# Patient Record
Sex: Male | Born: 1940 | Race: White | Hispanic: No | Marital: Married | Smoking: Never smoker
Health system: Southern US, Community
[De-identification: ages and names within clinical notes are randomized; demographics above are authoritative.]

## PROBLEM LIST (undated history)

## (undated) DIAGNOSIS — I351 Nonrheumatic aortic (valve) insufficiency: Secondary | ICD-10-CM

## (undated) DIAGNOSIS — E785 Hyperlipidemia, unspecified: Secondary | ICD-10-CM

## (undated) DIAGNOSIS — A491 Streptococcal infection, unspecified site: Secondary | ICD-10-CM

## (undated) DIAGNOSIS — E119 Type 2 diabetes mellitus without complications: Secondary | ICD-10-CM

## (undated) DIAGNOSIS — I350 Nonrheumatic aortic (valve) stenosis: Secondary | ICD-10-CM

## (undated) DIAGNOSIS — I10 Essential (primary) hypertension: Secondary | ICD-10-CM

---

## 2011-09-23 DIAGNOSIS — J209 Acute bronchitis, unspecified: Secondary | ICD-10-CM | POA: Diagnosis not present

## 2011-11-18 DIAGNOSIS — J069 Acute upper respiratory infection, unspecified: Secondary | ICD-10-CM | POA: Diagnosis not present

## 2012-07-15 DIAGNOSIS — Z23 Encounter for immunization: Secondary | ICD-10-CM | POA: Diagnosis not present

## 2012-09-05 DIAGNOSIS — J069 Acute upper respiratory infection, unspecified: Secondary | ICD-10-CM | POA: Diagnosis not present

## 2012-09-05 DIAGNOSIS — R059 Cough, unspecified: Secondary | ICD-10-CM | POA: Diagnosis not present

## 2012-09-05 DIAGNOSIS — R05 Cough: Secondary | ICD-10-CM | POA: Diagnosis not present

## 2012-10-06 DIAGNOSIS — E876 Hypokalemia: Secondary | ICD-10-CM | POA: Diagnosis not present

## 2012-10-06 DIAGNOSIS — E785 Hyperlipidemia, unspecified: Secondary | ICD-10-CM | POA: Diagnosis not present

## 2012-10-06 DIAGNOSIS — I359 Nonrheumatic aortic valve disorder, unspecified: Secondary | ICD-10-CM | POA: Diagnosis not present

## 2012-10-06 DIAGNOSIS — I10 Essential (primary) hypertension: Secondary | ICD-10-CM | POA: Diagnosis not present

## 2013-04-14 DIAGNOSIS — L570 Actinic keratosis: Secondary | ICD-10-CM | POA: Diagnosis not present

## 2013-07-16 DIAGNOSIS — Z23 Encounter for immunization: Secondary | ICD-10-CM | POA: Diagnosis not present

## 2013-09-06 DIAGNOSIS — M109 Gout, unspecified: Secondary | ICD-10-CM | POA: Diagnosis not present

## 2013-09-06 DIAGNOSIS — M7730 Calcaneal spur, unspecified foot: Secondary | ICD-10-CM | POA: Diagnosis not present

## 2013-09-06 DIAGNOSIS — M19079 Primary osteoarthritis, unspecified ankle and foot: Secondary | ICD-10-CM | POA: Diagnosis not present

## 2013-11-01 DIAGNOSIS — I10 Essential (primary) hypertension: Secondary | ICD-10-CM | POA: Diagnosis not present

## 2013-11-01 DIAGNOSIS — I359 Nonrheumatic aortic valve disorder, unspecified: Secondary | ICD-10-CM | POA: Diagnosis not present

## 2013-11-01 DIAGNOSIS — E785 Hyperlipidemia, unspecified: Secondary | ICD-10-CM | POA: Diagnosis not present

## 2013-11-09 DIAGNOSIS — I359 Nonrheumatic aortic valve disorder, unspecified: Secondary | ICD-10-CM | POA: Diagnosis not present

## 2013-12-14 DIAGNOSIS — H52229 Regular astigmatism, unspecified eye: Secondary | ICD-10-CM | POA: Diagnosis not present

## 2013-12-14 DIAGNOSIS — H2589 Other age-related cataract: Secondary | ICD-10-CM | POA: Diagnosis not present

## 2013-12-14 DIAGNOSIS — I10 Essential (primary) hypertension: Secondary | ICD-10-CM | POA: Diagnosis not present

## 2013-12-14 DIAGNOSIS — H5210 Myopia, unspecified eye: Secondary | ICD-10-CM | POA: Diagnosis not present

## 2013-12-31 DIAGNOSIS — M109 Gout, unspecified: Secondary | ICD-10-CM | POA: Diagnosis not present

## 2014-09-22 DIAGNOSIS — M109 Gout, unspecified: Secondary | ICD-10-CM | POA: Diagnosis not present

## 2014-09-22 DIAGNOSIS — J209 Acute bronchitis, unspecified: Secondary | ICD-10-CM | POA: Diagnosis not present

## 2015-01-16 DIAGNOSIS — D509 Iron deficiency anemia, unspecified: Secondary | ICD-10-CM | POA: Diagnosis not present

## 2015-01-16 DIAGNOSIS — Z23 Encounter for immunization: Secondary | ICD-10-CM | POA: Diagnosis not present

## 2015-01-16 DIAGNOSIS — Z9181 History of falling: Secondary | ICD-10-CM | POA: Diagnosis not present

## 2015-01-16 DIAGNOSIS — M109 Gout, unspecified: Secondary | ICD-10-CM | POA: Diagnosis not present

## 2015-01-16 DIAGNOSIS — Z0000 Encounter for general adult medical examination without abnormal findings: Secondary | ICD-10-CM | POA: Diagnosis not present

## 2015-01-16 DIAGNOSIS — Z1389 Encounter for screening for other disorder: Secondary | ICD-10-CM | POA: Diagnosis not present

## 2015-01-16 DIAGNOSIS — K219 Gastro-esophageal reflux disease without esophagitis: Secondary | ICD-10-CM | POA: Diagnosis not present

## 2015-01-16 DIAGNOSIS — R5383 Other fatigue: Secondary | ICD-10-CM | POA: Diagnosis not present

## 2015-01-20 DIAGNOSIS — L814 Other melanin hyperpigmentation: Secondary | ICD-10-CM | POA: Diagnosis not present

## 2015-01-20 DIAGNOSIS — L918 Other hypertrophic disorders of the skin: Secondary | ICD-10-CM | POA: Diagnosis not present

## 2015-01-20 DIAGNOSIS — L820 Inflamed seborrheic keratosis: Secondary | ICD-10-CM | POA: Diagnosis not present

## 2015-03-08 DIAGNOSIS — Z79899 Other long term (current) drug therapy: Secondary | ICD-10-CM | POA: Diagnosis not present

## 2015-05-30 DIAGNOSIS — I351 Nonrheumatic aortic (valve) insufficiency: Secondary | ICD-10-CM | POA: Diagnosis not present

## 2015-05-30 DIAGNOSIS — E785 Hyperlipidemia, unspecified: Secondary | ICD-10-CM | POA: Diagnosis not present

## 2015-05-30 DIAGNOSIS — I10 Essential (primary) hypertension: Secondary | ICD-10-CM | POA: Diagnosis not present

## 2015-05-30 DIAGNOSIS — I350 Nonrheumatic aortic (valve) stenosis: Secondary | ICD-10-CM | POA: Diagnosis not present

## 2015-09-01 DIAGNOSIS — Z23 Encounter for immunization: Secondary | ICD-10-CM | POA: Diagnosis not present

## 2016-02-05 DIAGNOSIS — E785 Hyperlipidemia, unspecified: Secondary | ICD-10-CM | POA: Diagnosis not present

## 2016-02-05 DIAGNOSIS — M109 Gout, unspecified: Secondary | ICD-10-CM | POA: Diagnosis not present

## 2016-02-05 DIAGNOSIS — I10 Essential (primary) hypertension: Secondary | ICD-10-CM | POA: Diagnosis not present

## 2016-02-05 DIAGNOSIS — D510 Vitamin B12 deficiency anemia due to intrinsic factor deficiency: Secondary | ICD-10-CM | POA: Diagnosis not present

## 2016-02-05 DIAGNOSIS — Z79899 Other long term (current) drug therapy: Secondary | ICD-10-CM | POA: Diagnosis not present

## 2016-02-05 DIAGNOSIS — R5383 Other fatigue: Secondary | ICD-10-CM | POA: Diagnosis not present

## 2016-02-05 DIAGNOSIS — Z0000 Encounter for general adult medical examination without abnormal findings: Secondary | ICD-10-CM | POA: Diagnosis not present

## 2016-05-21 DIAGNOSIS — I10 Essential (primary) hypertension: Secondary | ICD-10-CM | POA: Diagnosis not present

## 2016-05-21 DIAGNOSIS — M109 Gout, unspecified: Secondary | ICD-10-CM | POA: Diagnosis not present

## 2016-05-21 DIAGNOSIS — Z9181 History of falling: Secondary | ICD-10-CM | POA: Diagnosis not present

## 2016-05-21 DIAGNOSIS — Z1389 Encounter for screening for other disorder: Secondary | ICD-10-CM | POA: Diagnosis not present

## 2016-05-21 DIAGNOSIS — Z23 Encounter for immunization: Secondary | ICD-10-CM | POA: Diagnosis not present

## 2016-06-18 DIAGNOSIS — E785 Hyperlipidemia, unspecified: Secondary | ICD-10-CM | POA: Diagnosis not present

## 2016-06-18 DIAGNOSIS — I350 Nonrheumatic aortic (valve) stenosis: Secondary | ICD-10-CM | POA: Diagnosis not present

## 2016-06-18 DIAGNOSIS — I10 Essential (primary) hypertension: Secondary | ICD-10-CM | POA: Diagnosis not present

## 2017-02-26 DIAGNOSIS — H524 Presbyopia: Secondary | ICD-10-CM | POA: Diagnosis not present

## 2017-02-26 DIAGNOSIS — H52223 Regular astigmatism, bilateral: Secondary | ICD-10-CM | POA: Diagnosis not present

## 2017-02-26 DIAGNOSIS — H25813 Combined forms of age-related cataract, bilateral: Secondary | ICD-10-CM | POA: Diagnosis not present

## 2017-02-26 DIAGNOSIS — I10 Essential (primary) hypertension: Secondary | ICD-10-CM | POA: Diagnosis not present

## 2017-02-26 DIAGNOSIS — H35343 Macular cyst, hole, or pseudohole, bilateral: Secondary | ICD-10-CM | POA: Diagnosis not present

## 2017-04-01 DIAGNOSIS — C44229 Squamous cell carcinoma of skin of left ear and external auricular canal: Secondary | ICD-10-CM | POA: Diagnosis not present

## 2017-04-01 DIAGNOSIS — C44119 Basal cell carcinoma of skin of left eyelid, including canthus: Secondary | ICD-10-CM | POA: Diagnosis not present

## 2017-04-01 DIAGNOSIS — D485 Neoplasm of uncertain behavior of skin: Secondary | ICD-10-CM | POA: Diagnosis not present

## 2017-04-17 DIAGNOSIS — C44119 Basal cell carcinoma of skin of left eyelid, including canthus: Secondary | ICD-10-CM | POA: Diagnosis not present

## 2017-06-24 DIAGNOSIS — E7800 Pure hypercholesterolemia, unspecified: Secondary | ICD-10-CM | POA: Diagnosis not present

## 2017-06-24 DIAGNOSIS — Z1331 Encounter for screening for depression: Secondary | ICD-10-CM | POA: Diagnosis not present

## 2017-06-24 DIAGNOSIS — D519 Vitamin B12 deficiency anemia, unspecified: Secondary | ICD-10-CM | POA: Diagnosis not present

## 2017-06-24 DIAGNOSIS — Z0000 Encounter for general adult medical examination without abnormal findings: Secondary | ICD-10-CM | POA: Diagnosis not present

## 2017-06-24 DIAGNOSIS — Z9181 History of falling: Secondary | ICD-10-CM | POA: Diagnosis not present

## 2017-06-24 DIAGNOSIS — Z1339 Encounter for screening examination for other mental health and behavioral disorders: Secondary | ICD-10-CM | POA: Diagnosis not present

## 2017-06-24 DIAGNOSIS — Z79899 Other long term (current) drug therapy: Secondary | ICD-10-CM | POA: Diagnosis not present

## 2017-06-24 DIAGNOSIS — Z23 Encounter for immunization: Secondary | ICD-10-CM | POA: Diagnosis not present

## 2017-06-24 DIAGNOSIS — E789 Disorder of lipoprotein metabolism, unspecified: Secondary | ICD-10-CM | POA: Diagnosis not present

## 2017-07-07 DIAGNOSIS — Z87891 Personal history of nicotine dependence: Secondary | ICD-10-CM | POA: Diagnosis not present

## 2017-07-07 DIAGNOSIS — Z7982 Long term (current) use of aspirin: Secondary | ICD-10-CM | POA: Diagnosis not present

## 2017-07-07 DIAGNOSIS — I351 Nonrheumatic aortic (valve) insufficiency: Secondary | ICD-10-CM | POA: Diagnosis not present

## 2017-07-07 DIAGNOSIS — I350 Nonrheumatic aortic (valve) stenosis: Secondary | ICD-10-CM | POA: Diagnosis not present

## 2017-07-07 DIAGNOSIS — I10 Essential (primary) hypertension: Secondary | ICD-10-CM | POA: Diagnosis not present

## 2017-07-07 DIAGNOSIS — E785 Hyperlipidemia, unspecified: Secondary | ICD-10-CM | POA: Diagnosis not present

## 2017-07-21 DIAGNOSIS — Z6826 Body mass index (BMI) 26.0-26.9, adult: Secondary | ICD-10-CM | POA: Diagnosis not present

## 2017-07-21 DIAGNOSIS — J111 Influenza due to unidentified influenza virus with other respiratory manifestations: Secondary | ICD-10-CM | POA: Diagnosis not present

## 2017-11-24 DIAGNOSIS — R69 Illness, unspecified: Secondary | ICD-10-CM | POA: Diagnosis not present

## 2017-11-26 DIAGNOSIS — G3184 Mild cognitive impairment, so stated: Secondary | ICD-10-CM | POA: Diagnosis not present

## 2017-11-26 DIAGNOSIS — Z8249 Family history of ischemic heart disease and other diseases of the circulatory system: Secondary | ICD-10-CM | POA: Diagnosis not present

## 2017-11-26 DIAGNOSIS — M109 Gout, unspecified: Secondary | ICD-10-CM | POA: Diagnosis not present

## 2017-11-26 DIAGNOSIS — K219 Gastro-esophageal reflux disease without esophagitis: Secondary | ICD-10-CM | POA: Diagnosis not present

## 2017-11-26 DIAGNOSIS — E785 Hyperlipidemia, unspecified: Secondary | ICD-10-CM | POA: Diagnosis not present

## 2017-11-26 DIAGNOSIS — Z833 Family history of diabetes mellitus: Secondary | ICD-10-CM | POA: Diagnosis not present

## 2017-11-26 DIAGNOSIS — I10 Essential (primary) hypertension: Secondary | ICD-10-CM | POA: Diagnosis not present

## 2017-11-26 DIAGNOSIS — Z7982 Long term (current) use of aspirin: Secondary | ICD-10-CM | POA: Diagnosis not present

## 2017-11-26 DIAGNOSIS — Z809 Family history of malignant neoplasm, unspecified: Secondary | ICD-10-CM | POA: Diagnosis not present

## 2017-11-26 DIAGNOSIS — Z7722 Contact with and (suspected) exposure to environmental tobacco smoke (acute) (chronic): Secondary | ICD-10-CM | POA: Diagnosis not present

## 2018-02-24 DIAGNOSIS — E1165 Type 2 diabetes mellitus with hyperglycemia: Secondary | ICD-10-CM | POA: Diagnosis not present

## 2018-02-24 DIAGNOSIS — R5383 Other fatigue: Secondary | ICD-10-CM | POA: Diagnosis not present

## 2018-02-24 DIAGNOSIS — Z6826 Body mass index (BMI) 26.0-26.9, adult: Secondary | ICD-10-CM | POA: Diagnosis not present

## 2018-02-24 DIAGNOSIS — D519 Vitamin B12 deficiency anemia, unspecified: Secondary | ICD-10-CM | POA: Diagnosis not present

## 2018-02-24 DIAGNOSIS — R358 Other polyuria: Secondary | ICD-10-CM | POA: Diagnosis not present

## 2018-02-24 DIAGNOSIS — R634 Abnormal weight loss: Secondary | ICD-10-CM | POA: Diagnosis not present

## 2018-02-26 DIAGNOSIS — R748 Abnormal levels of other serum enzymes: Secondary | ICD-10-CM | POA: Diagnosis not present

## 2018-02-26 DIAGNOSIS — E1165 Type 2 diabetes mellitus with hyperglycemia: Secondary | ICD-10-CM | POA: Diagnosis not present

## 2018-02-26 DIAGNOSIS — Z6824 Body mass index (BMI) 24.0-24.9, adult: Secondary | ICD-10-CM | POA: Diagnosis not present

## 2018-02-26 DIAGNOSIS — R634 Abnormal weight loss: Secondary | ICD-10-CM | POA: Diagnosis not present

## 2018-02-26 DIAGNOSIS — R69 Illness, unspecified: Secondary | ICD-10-CM | POA: Diagnosis not present

## 2018-02-26 DIAGNOSIS — Z125 Encounter for screening for malignant neoplasm of prostate: Secondary | ICD-10-CM | POA: Diagnosis not present

## 2018-03-02 DIAGNOSIS — R69 Illness, unspecified: Secondary | ICD-10-CM | POA: Diagnosis not present

## 2018-03-12 DIAGNOSIS — Z1331 Encounter for screening for depression: Secondary | ICD-10-CM | POA: Diagnosis not present

## 2018-03-12 DIAGNOSIS — Z6825 Body mass index (BMI) 25.0-25.9, adult: Secondary | ICD-10-CM | POA: Diagnosis not present

## 2018-03-12 DIAGNOSIS — R634 Abnormal weight loss: Secondary | ICD-10-CM | POA: Diagnosis not present

## 2018-03-12 DIAGNOSIS — E1165 Type 2 diabetes mellitus with hyperglycemia: Secondary | ICD-10-CM | POA: Diagnosis not present

## 2018-03-13 DIAGNOSIS — R634 Abnormal weight loss: Secondary | ICD-10-CM | POA: Diagnosis not present

## 2018-03-13 DIAGNOSIS — K5730 Diverticulosis of large intestine without perforation or abscess without bleeding: Secondary | ICD-10-CM | POA: Diagnosis not present

## 2018-03-13 DIAGNOSIS — I700 Atherosclerosis of aorta: Secondary | ICD-10-CM | POA: Diagnosis not present

## 2018-03-24 DIAGNOSIS — R69 Illness, unspecified: Secondary | ICD-10-CM | POA: Diagnosis not present

## 2018-03-27 DIAGNOSIS — K5900 Constipation, unspecified: Secondary | ICD-10-CM | POA: Diagnosis not present

## 2018-03-27 DIAGNOSIS — R1030 Lower abdominal pain, unspecified: Secondary | ICD-10-CM | POA: Diagnosis not present

## 2018-03-28 DIAGNOSIS — R69 Illness, unspecified: Secondary | ICD-10-CM | POA: Diagnosis not present

## 2018-04-19 DIAGNOSIS — R69 Illness, unspecified: Secondary | ICD-10-CM | POA: Diagnosis not present

## 2018-04-23 DIAGNOSIS — R69 Illness, unspecified: Secondary | ICD-10-CM | POA: Diagnosis not present

## 2018-05-11 DIAGNOSIS — R69 Illness, unspecified: Secondary | ICD-10-CM | POA: Diagnosis not present

## 2018-05-25 DIAGNOSIS — R69 Illness, unspecified: Secondary | ICD-10-CM | POA: Diagnosis not present

## 2018-06-02 DIAGNOSIS — R69 Illness, unspecified: Secondary | ICD-10-CM | POA: Diagnosis not present

## 2018-07-06 DIAGNOSIS — R69 Illness, unspecified: Secondary | ICD-10-CM | POA: Diagnosis not present

## 2018-07-07 DIAGNOSIS — R69 Illness, unspecified: Secondary | ICD-10-CM | POA: Diagnosis not present

## 2018-07-17 DIAGNOSIS — E782 Mixed hyperlipidemia: Secondary | ICD-10-CM | POA: Diagnosis not present

## 2018-07-17 DIAGNOSIS — I10 Essential (primary) hypertension: Secondary | ICD-10-CM | POA: Diagnosis not present

## 2018-07-17 DIAGNOSIS — I350 Nonrheumatic aortic (valve) stenosis: Secondary | ICD-10-CM | POA: Diagnosis not present

## 2018-07-17 DIAGNOSIS — I351 Nonrheumatic aortic (valve) insufficiency: Secondary | ICD-10-CM | POA: Diagnosis not present

## 2018-07-22 DIAGNOSIS — I351 Nonrheumatic aortic (valve) insufficiency: Secondary | ICD-10-CM | POA: Diagnosis not present

## 2018-07-22 DIAGNOSIS — I350 Nonrheumatic aortic (valve) stenosis: Secondary | ICD-10-CM | POA: Diagnosis not present

## 2018-07-23 DIAGNOSIS — R69 Illness, unspecified: Secondary | ICD-10-CM | POA: Diagnosis not present

## 2018-08-21 DIAGNOSIS — R69 Illness, unspecified: Secondary | ICD-10-CM | POA: Diagnosis not present

## 2018-09-09 DIAGNOSIS — I350 Nonrheumatic aortic (valve) stenosis: Secondary | ICD-10-CM | POA: Diagnosis not present

## 2018-09-09 DIAGNOSIS — E782 Mixed hyperlipidemia: Secondary | ICD-10-CM | POA: Diagnosis not present

## 2018-09-09 DIAGNOSIS — I351 Nonrheumatic aortic (valve) insufficiency: Secondary | ICD-10-CM | POA: Diagnosis not present

## 2018-09-09 DIAGNOSIS — I10 Essential (primary) hypertension: Secondary | ICD-10-CM | POA: Diagnosis not present

## 2018-09-29 DIAGNOSIS — I10 Essential (primary) hypertension: Secondary | ICD-10-CM | POA: Diagnosis not present

## 2018-09-29 DIAGNOSIS — Z833 Family history of diabetes mellitus: Secondary | ICD-10-CM | POA: Diagnosis not present

## 2018-09-29 DIAGNOSIS — Z809 Family history of malignant neoplasm, unspecified: Secondary | ICD-10-CM | POA: Diagnosis not present

## 2018-09-29 DIAGNOSIS — Z7982 Long term (current) use of aspirin: Secondary | ICD-10-CM | POA: Diagnosis not present

## 2018-09-29 DIAGNOSIS — Z8249 Family history of ischemic heart disease and other diseases of the circulatory system: Secondary | ICD-10-CM | POA: Diagnosis not present

## 2018-09-29 DIAGNOSIS — E785 Hyperlipidemia, unspecified: Secondary | ICD-10-CM | POA: Diagnosis not present

## 2018-10-22 DIAGNOSIS — R69 Illness, unspecified: Secondary | ICD-10-CM | POA: Diagnosis not present

## 2018-11-16 DIAGNOSIS — R69 Illness, unspecified: Secondary | ICD-10-CM | POA: Diagnosis not present

## 2019-01-18 DIAGNOSIS — R69 Illness, unspecified: Secondary | ICD-10-CM | POA: Diagnosis not present

## 2019-03-03 DIAGNOSIS — G479 Sleep disorder, unspecified: Secondary | ICD-10-CM | POA: Diagnosis not present

## 2019-03-03 DIAGNOSIS — E1165 Type 2 diabetes mellitus with hyperglycemia: Secondary | ICD-10-CM | POA: Diagnosis not present

## 2019-03-03 DIAGNOSIS — M109 Gout, unspecified: Secondary | ICD-10-CM | POA: Diagnosis not present

## 2019-03-03 DIAGNOSIS — Z9119 Patient's noncompliance with other medical treatment and regimen: Secondary | ICD-10-CM | POA: Diagnosis not present

## 2019-03-03 DIAGNOSIS — Z0000 Encounter for general adult medical examination without abnormal findings: Secondary | ICD-10-CM | POA: Diagnosis not present

## 2019-03-03 DIAGNOSIS — Z79899 Other long term (current) drug therapy: Secondary | ICD-10-CM | POA: Diagnosis not present

## 2019-03-03 DIAGNOSIS — I10 Essential (primary) hypertension: Secondary | ICD-10-CM | POA: Diagnosis not present

## 2019-03-03 DIAGNOSIS — R5383 Other fatigue: Secondary | ICD-10-CM | POA: Diagnosis not present

## 2019-03-03 DIAGNOSIS — Z6824 Body mass index (BMI) 24.0-24.9, adult: Secondary | ICD-10-CM | POA: Diagnosis not present

## 2019-03-03 DIAGNOSIS — E785 Hyperlipidemia, unspecified: Secondary | ICD-10-CM | POA: Diagnosis not present

## 2019-03-07 DIAGNOSIS — R69 Illness, unspecified: Secondary | ICD-10-CM | POA: Diagnosis not present

## 2019-03-10 DIAGNOSIS — Z6824 Body mass index (BMI) 24.0-24.9, adult: Secondary | ICD-10-CM | POA: Diagnosis not present

## 2019-03-10 DIAGNOSIS — R000 Tachycardia, unspecified: Secondary | ICD-10-CM | POA: Diagnosis not present

## 2019-03-10 DIAGNOSIS — I951 Orthostatic hypotension: Secondary | ICD-10-CM | POA: Diagnosis not present

## 2019-03-10 DIAGNOSIS — I10 Essential (primary) hypertension: Secondary | ICD-10-CM | POA: Diagnosis not present

## 2019-03-13 DIAGNOSIS — I10 Essential (primary) hypertension: Secondary | ICD-10-CM | POA: Diagnosis not present

## 2019-03-13 DIAGNOSIS — N401 Enlarged prostate with lower urinary tract symptoms: Secondary | ICD-10-CM | POA: Diagnosis not present

## 2019-03-13 DIAGNOSIS — E785 Hyperlipidemia, unspecified: Secondary | ICD-10-CM | POA: Diagnosis not present

## 2019-03-13 DIAGNOSIS — K921 Melena: Secondary | ICD-10-CM | POA: Diagnosis not present

## 2019-03-13 DIAGNOSIS — E119 Type 2 diabetes mellitus without complications: Secondary | ICD-10-CM | POA: Diagnosis not present

## 2019-03-13 DIAGNOSIS — R739 Hyperglycemia, unspecified: Secondary | ICD-10-CM | POA: Diagnosis not present

## 2019-03-13 DIAGNOSIS — E46 Unspecified protein-calorie malnutrition: Secondary | ICD-10-CM | POA: Diagnosis not present

## 2019-03-13 DIAGNOSIS — E860 Dehydration: Secondary | ICD-10-CM | POA: Diagnosis not present

## 2019-03-13 DIAGNOSIS — R748 Abnormal levels of other serum enzymes: Secondary | ICD-10-CM | POA: Diagnosis not present

## 2019-03-13 DIAGNOSIS — E871 Hypo-osmolality and hyponatremia: Secondary | ICD-10-CM | POA: Diagnosis not present

## 2019-03-13 DIAGNOSIS — I352 Nonrheumatic aortic (valve) stenosis with insufficiency: Secondary | ICD-10-CM | POA: Diagnosis not present

## 2019-03-13 DIAGNOSIS — I959 Hypotension, unspecified: Secondary | ICD-10-CM | POA: Diagnosis not present

## 2019-03-13 DIAGNOSIS — Z452 Encounter for adjustment and management of vascular access device: Secondary | ICD-10-CM | POA: Diagnosis not present

## 2019-03-13 DIAGNOSIS — R9431 Abnormal electrocardiogram [ECG] [EKG]: Secondary | ICD-10-CM | POA: Diagnosis not present

## 2019-03-13 DIAGNOSIS — N179 Acute kidney failure, unspecified: Secondary | ICD-10-CM | POA: Diagnosis not present

## 2019-03-13 DIAGNOSIS — Z792 Long term (current) use of antibiotics: Secondary | ICD-10-CM | POA: Diagnosis not present

## 2019-03-13 DIAGNOSIS — B955 Unspecified streptococcus as the cause of diseases classified elsewhere: Secondary | ICD-10-CM | POA: Diagnosis not present

## 2019-03-13 DIAGNOSIS — I517 Cardiomegaly: Secondary | ICD-10-CM | POA: Diagnosis not present

## 2019-03-13 DIAGNOSIS — D72829 Elevated white blood cell count, unspecified: Secondary | ICD-10-CM | POA: Diagnosis not present

## 2019-03-13 DIAGNOSIS — R531 Weakness: Secondary | ICD-10-CM | POA: Diagnosis not present

## 2019-03-13 DIAGNOSIS — R7989 Other specified abnormal findings of blood chemistry: Secondary | ICD-10-CM | POA: Diagnosis not present

## 2019-03-13 DIAGNOSIS — E1165 Type 2 diabetes mellitus with hyperglycemia: Secondary | ICD-10-CM | POA: Diagnosis not present

## 2019-03-13 DIAGNOSIS — I350 Nonrheumatic aortic (valve) stenosis: Secondary | ICD-10-CM | POA: Diagnosis not present

## 2019-03-13 DIAGNOSIS — Z794 Long term (current) use of insulin: Secondary | ICD-10-CM | POA: Diagnosis not present

## 2019-03-13 DIAGNOSIS — R509 Fever, unspecified: Secondary | ICD-10-CM | POA: Diagnosis not present

## 2019-03-13 DIAGNOSIS — I9589 Other hypotension: Secondary | ICD-10-CM | POA: Diagnosis not present

## 2019-03-13 DIAGNOSIS — R7881 Bacteremia: Secondary | ICD-10-CM | POA: Diagnosis not present

## 2019-03-13 DIAGNOSIS — B954 Other streptococcus as the cause of diseases classified elsewhere: Secondary | ICD-10-CM | POA: Diagnosis not present

## 2019-03-13 DIAGNOSIS — I700 Atherosclerosis of aorta: Secondary | ICD-10-CM | POA: Diagnosis not present

## 2019-03-13 DIAGNOSIS — N281 Cyst of kidney, acquired: Secondary | ICD-10-CM | POA: Diagnosis not present

## 2019-03-13 DIAGNOSIS — I330 Acute and subacute infective endocarditis: Secondary | ICD-10-CM | POA: Diagnosis not present

## 2019-03-13 DIAGNOSIS — I351 Nonrheumatic aortic (valve) insufficiency: Secondary | ICD-10-CM | POA: Diagnosis not present

## 2019-03-14 DIAGNOSIS — E871 Hypo-osmolality and hyponatremia: Secondary | ICD-10-CM | POA: Diagnosis not present

## 2019-03-14 DIAGNOSIS — I959 Hypotension, unspecified: Secondary | ICD-10-CM | POA: Diagnosis not present

## 2019-03-14 DIAGNOSIS — R531 Weakness: Secondary | ICD-10-CM | POA: Diagnosis not present

## 2019-03-14 DIAGNOSIS — R7989 Other specified abnormal findings of blood chemistry: Secondary | ICD-10-CM | POA: Diagnosis not present

## 2019-03-14 DIAGNOSIS — R509 Fever, unspecified: Secondary | ICD-10-CM | POA: Diagnosis not present

## 2019-03-14 DIAGNOSIS — I350 Nonrheumatic aortic (valve) stenosis: Secondary | ICD-10-CM | POA: Diagnosis not present

## 2019-03-14 DIAGNOSIS — R9431 Abnormal electrocardiogram [ECG] [EKG]: Secondary | ICD-10-CM | POA: Diagnosis not present

## 2019-03-14 DIAGNOSIS — D72829 Elevated white blood cell count, unspecified: Secondary | ICD-10-CM | POA: Diagnosis not present

## 2019-03-14 DIAGNOSIS — N179 Acute kidney failure, unspecified: Secondary | ICD-10-CM | POA: Diagnosis not present

## 2019-03-14 DIAGNOSIS — I10 Essential (primary) hypertension: Secondary | ICD-10-CM | POA: Diagnosis not present

## 2019-03-15 DIAGNOSIS — N179 Acute kidney failure, unspecified: Secondary | ICD-10-CM | POA: Diagnosis not present

## 2019-03-15 DIAGNOSIS — I350 Nonrheumatic aortic (valve) stenosis: Secondary | ICD-10-CM | POA: Diagnosis not present

## 2019-03-15 DIAGNOSIS — R7881 Bacteremia: Secondary | ICD-10-CM | POA: Diagnosis not present

## 2019-03-15 DIAGNOSIS — I10 Essential (primary) hypertension: Secondary | ICD-10-CM | POA: Diagnosis not present

## 2019-03-15 DIAGNOSIS — R509 Fever, unspecified: Secondary | ICD-10-CM | POA: Diagnosis not present

## 2019-03-15 DIAGNOSIS — D72829 Elevated white blood cell count, unspecified: Secondary | ICD-10-CM | POA: Diagnosis not present

## 2019-03-15 DIAGNOSIS — I351 Nonrheumatic aortic (valve) insufficiency: Secondary | ICD-10-CM | POA: Diagnosis not present

## 2019-03-15 DIAGNOSIS — I517 Cardiomegaly: Secondary | ICD-10-CM | POA: Diagnosis not present

## 2019-03-15 DIAGNOSIS — E1165 Type 2 diabetes mellitus with hyperglycemia: Secondary | ICD-10-CM | POA: Diagnosis not present

## 2019-03-15 DIAGNOSIS — Z794 Long term (current) use of insulin: Secondary | ICD-10-CM | POA: Diagnosis not present

## 2019-03-15 DIAGNOSIS — I959 Hypotension, unspecified: Secondary | ICD-10-CM | POA: Diagnosis not present

## 2019-03-16 DIAGNOSIS — I10 Essential (primary) hypertension: Secondary | ICD-10-CM | POA: Diagnosis not present

## 2019-03-16 DIAGNOSIS — R7881 Bacteremia: Secondary | ICD-10-CM | POA: Diagnosis not present

## 2019-03-16 DIAGNOSIS — N179 Acute kidney failure, unspecified: Secondary | ICD-10-CM | POA: Diagnosis not present

## 2019-03-16 DIAGNOSIS — I959 Hypotension, unspecified: Secondary | ICD-10-CM | POA: Diagnosis not present

## 2019-03-16 DIAGNOSIS — Z792 Long term (current) use of antibiotics: Secondary | ICD-10-CM | POA: Diagnosis not present

## 2019-03-16 DIAGNOSIS — I350 Nonrheumatic aortic (valve) stenosis: Secondary | ICD-10-CM | POA: Diagnosis not present

## 2019-03-16 DIAGNOSIS — I352 Nonrheumatic aortic (valve) stenosis with insufficiency: Secondary | ICD-10-CM | POA: Diagnosis not present

## 2019-03-16 DIAGNOSIS — B954 Other streptococcus as the cause of diseases classified elsewhere: Secondary | ICD-10-CM | POA: Diagnosis not present

## 2019-03-16 DIAGNOSIS — R509 Fever, unspecified: Secondary | ICD-10-CM | POA: Diagnosis not present

## 2019-03-16 DIAGNOSIS — D72829 Elevated white blood cell count, unspecified: Secondary | ICD-10-CM | POA: Diagnosis not present

## 2019-03-17 DIAGNOSIS — I9589 Other hypotension: Secondary | ICD-10-CM | POA: Diagnosis not present

## 2019-03-17 DIAGNOSIS — I350 Nonrheumatic aortic (valve) stenosis: Secondary | ICD-10-CM | POA: Diagnosis not present

## 2019-03-17 DIAGNOSIS — I352 Nonrheumatic aortic (valve) stenosis with insufficiency: Secondary | ICD-10-CM | POA: Diagnosis not present

## 2019-03-17 DIAGNOSIS — E871 Hypo-osmolality and hyponatremia: Secondary | ICD-10-CM | POA: Diagnosis not present

## 2019-03-17 DIAGNOSIS — R7881 Bacteremia: Secondary | ICD-10-CM | POA: Diagnosis not present

## 2019-03-17 DIAGNOSIS — E1165 Type 2 diabetes mellitus with hyperglycemia: Secondary | ICD-10-CM | POA: Diagnosis not present

## 2019-03-17 DIAGNOSIS — N179 Acute kidney failure, unspecified: Secondary | ICD-10-CM | POA: Diagnosis not present

## 2019-03-18 DIAGNOSIS — Z452 Encounter for adjustment and management of vascular access device: Secondary | ICD-10-CM | POA: Diagnosis not present

## 2019-03-18 DIAGNOSIS — I350 Nonrheumatic aortic (valve) stenosis: Secondary | ICD-10-CM | POA: Diagnosis not present

## 2019-03-18 DIAGNOSIS — B955 Unspecified streptococcus as the cause of diseases classified elsewhere: Secondary | ICD-10-CM | POA: Diagnosis not present

## 2019-03-18 DIAGNOSIS — R739 Hyperglycemia, unspecified: Secondary | ICD-10-CM | POA: Diagnosis not present

## 2019-03-18 DIAGNOSIS — K921 Melena: Secondary | ICD-10-CM | POA: Diagnosis not present

## 2019-03-18 DIAGNOSIS — R7881 Bacteremia: Secondary | ICD-10-CM | POA: Diagnosis not present

## 2019-03-18 DIAGNOSIS — I352 Nonrheumatic aortic (valve) stenosis with insufficiency: Secondary | ICD-10-CM | POA: Diagnosis not present

## 2019-03-18 DIAGNOSIS — I330 Acute and subacute infective endocarditis: Secondary | ICD-10-CM | POA: Diagnosis not present

## 2019-03-19 DIAGNOSIS — I959 Hypotension, unspecified: Secondary | ICD-10-CM | POA: Diagnosis not present

## 2019-03-19 DIAGNOSIS — R7881 Bacteremia: Secondary | ICD-10-CM | POA: Diagnosis not present

## 2019-03-19 DIAGNOSIS — Z452 Encounter for adjustment and management of vascular access device: Secondary | ICD-10-CM | POA: Diagnosis not present

## 2019-03-19 DIAGNOSIS — I10 Essential (primary) hypertension: Secondary | ICD-10-CM | POA: Diagnosis not present

## 2019-03-19 DIAGNOSIS — I352 Nonrheumatic aortic (valve) stenosis with insufficiency: Secondary | ICD-10-CM | POA: Diagnosis not present

## 2019-03-19 DIAGNOSIS — B954 Other streptococcus as the cause of diseases classified elsewhere: Secondary | ICD-10-CM | POA: Diagnosis not present

## 2019-03-19 DIAGNOSIS — Z792 Long term (current) use of antibiotics: Secondary | ICD-10-CM | POA: Diagnosis not present

## 2019-03-19 DIAGNOSIS — E785 Hyperlipidemia, unspecified: Secondary | ICD-10-CM | POA: Diagnosis not present

## 2019-03-19 DIAGNOSIS — E1165 Type 2 diabetes mellitus with hyperglycemia: Secondary | ICD-10-CM | POA: Diagnosis not present

## 2019-03-19 DIAGNOSIS — Z720 Tobacco use: Secondary | ICD-10-CM | POA: Diagnosis not present

## 2019-03-20 DIAGNOSIS — R7881 Bacteremia: Secondary | ICD-10-CM | POA: Diagnosis not present

## 2019-03-21 DIAGNOSIS — R7881 Bacteremia: Secondary | ICD-10-CM | POA: Diagnosis not present

## 2019-03-22 DIAGNOSIS — E785 Hyperlipidemia, unspecified: Secondary | ICD-10-CM | POA: Diagnosis not present

## 2019-03-22 DIAGNOSIS — I352 Nonrheumatic aortic (valve) stenosis with insufficiency: Secondary | ICD-10-CM | POA: Diagnosis not present

## 2019-03-22 DIAGNOSIS — Z792 Long term (current) use of antibiotics: Secondary | ICD-10-CM | POA: Diagnosis not present

## 2019-03-22 DIAGNOSIS — Z720 Tobacco use: Secondary | ICD-10-CM | POA: Diagnosis not present

## 2019-03-22 DIAGNOSIS — R7881 Bacteremia: Secondary | ICD-10-CM | POA: Diagnosis not present

## 2019-03-22 DIAGNOSIS — I959 Hypotension, unspecified: Secondary | ICD-10-CM | POA: Diagnosis not present

## 2019-03-22 DIAGNOSIS — I10 Essential (primary) hypertension: Secondary | ICD-10-CM | POA: Diagnosis not present

## 2019-03-22 DIAGNOSIS — B954 Other streptococcus as the cause of diseases classified elsewhere: Secondary | ICD-10-CM | POA: Diagnosis not present

## 2019-03-22 DIAGNOSIS — E1165 Type 2 diabetes mellitus with hyperglycemia: Secondary | ICD-10-CM | POA: Diagnosis not present

## 2019-03-22 DIAGNOSIS — Z452 Encounter for adjustment and management of vascular access device: Secondary | ICD-10-CM | POA: Diagnosis not present

## 2019-03-23 DIAGNOSIS — R7881 Bacteremia: Secondary | ICD-10-CM | POA: Diagnosis not present

## 2019-03-24 DIAGNOSIS — R7881 Bacteremia: Secondary | ICD-10-CM | POA: Diagnosis not present

## 2019-03-25 DIAGNOSIS — R7881 Bacteremia: Secondary | ICD-10-CM | POA: Diagnosis not present

## 2019-03-25 DIAGNOSIS — R000 Tachycardia, unspecified: Secondary | ICD-10-CM | POA: Diagnosis not present

## 2019-03-25 DIAGNOSIS — I471 Supraventricular tachycardia: Secondary | ICD-10-CM | POA: Diagnosis not present

## 2019-03-26 DIAGNOSIS — I493 Ventricular premature depolarization: Secondary | ICD-10-CM | POA: Diagnosis not present

## 2019-03-26 DIAGNOSIS — I471 Supraventricular tachycardia: Secondary | ICD-10-CM | POA: Diagnosis not present

## 2019-03-26 DIAGNOSIS — I491 Atrial premature depolarization: Secondary | ICD-10-CM | POA: Diagnosis not present

## 2019-03-26 DIAGNOSIS — R7881 Bacteremia: Secondary | ICD-10-CM | POA: Diagnosis not present

## 2019-03-27 DIAGNOSIS — R7881 Bacteremia: Secondary | ICD-10-CM | POA: Diagnosis not present

## 2019-03-28 DIAGNOSIS — R7881 Bacteremia: Secondary | ICD-10-CM | POA: Diagnosis not present

## 2019-03-29 DIAGNOSIS — B954 Other streptococcus as the cause of diseases classified elsewhere: Secondary | ICD-10-CM | POA: Diagnosis not present

## 2019-03-29 DIAGNOSIS — E785 Hyperlipidemia, unspecified: Secondary | ICD-10-CM | POA: Diagnosis not present

## 2019-03-29 DIAGNOSIS — I959 Hypotension, unspecified: Secondary | ICD-10-CM | POA: Diagnosis not present

## 2019-03-29 DIAGNOSIS — I352 Nonrheumatic aortic (valve) stenosis with insufficiency: Secondary | ICD-10-CM | POA: Diagnosis not present

## 2019-03-29 DIAGNOSIS — R7881 Bacteremia: Secondary | ICD-10-CM | POA: Diagnosis not present

## 2019-03-29 DIAGNOSIS — I10 Essential (primary) hypertension: Secondary | ICD-10-CM | POA: Diagnosis not present

## 2019-03-29 DIAGNOSIS — Z720 Tobacco use: Secondary | ICD-10-CM | POA: Diagnosis not present

## 2019-03-29 DIAGNOSIS — Z452 Encounter for adjustment and management of vascular access device: Secondary | ICD-10-CM | POA: Diagnosis not present

## 2019-03-29 DIAGNOSIS — E1165 Type 2 diabetes mellitus with hyperglycemia: Secondary | ICD-10-CM | POA: Diagnosis not present

## 2019-03-29 DIAGNOSIS — Z792 Long term (current) use of antibiotics: Secondary | ICD-10-CM | POA: Diagnosis not present

## 2019-03-30 DIAGNOSIS — R7881 Bacteremia: Secondary | ICD-10-CM | POA: Diagnosis not present

## 2019-03-31 DIAGNOSIS — R7881 Bacteremia: Secondary | ICD-10-CM | POA: Diagnosis not present

## 2019-04-01 DIAGNOSIS — R7881 Bacteremia: Secondary | ICD-10-CM | POA: Diagnosis not present

## 2019-04-02 DIAGNOSIS — R7881 Bacteremia: Secondary | ICD-10-CM | POA: Diagnosis not present

## 2019-04-03 DIAGNOSIS — R7881 Bacteremia: Secondary | ICD-10-CM | POA: Diagnosis not present

## 2019-04-04 DIAGNOSIS — R7881 Bacteremia: Secondary | ICD-10-CM | POA: Diagnosis not present

## 2019-04-05 DIAGNOSIS — I350 Nonrheumatic aortic (valve) stenosis: Secondary | ICD-10-CM | POA: Diagnosis not present

## 2019-04-05 DIAGNOSIS — E785 Hyperlipidemia, unspecified: Secondary | ICD-10-CM | POA: Diagnosis not present

## 2019-04-05 DIAGNOSIS — Z720 Tobacco use: Secondary | ICD-10-CM | POA: Diagnosis not present

## 2019-04-05 DIAGNOSIS — I352 Nonrheumatic aortic (valve) stenosis with insufficiency: Secondary | ICD-10-CM | POA: Diagnosis not present

## 2019-04-05 DIAGNOSIS — R7881 Bacteremia: Secondary | ICD-10-CM | POA: Diagnosis not present

## 2019-04-05 DIAGNOSIS — B954 Other streptococcus as the cause of diseases classified elsewhere: Secondary | ICD-10-CM | POA: Diagnosis not present

## 2019-04-05 DIAGNOSIS — E1165 Type 2 diabetes mellitus with hyperglycemia: Secondary | ICD-10-CM | POA: Diagnosis not present

## 2019-04-05 DIAGNOSIS — I351 Nonrheumatic aortic (valve) insufficiency: Secondary | ICD-10-CM | POA: Diagnosis not present

## 2019-04-05 DIAGNOSIS — Z452 Encounter for adjustment and management of vascular access device: Secondary | ICD-10-CM | POA: Diagnosis not present

## 2019-04-05 DIAGNOSIS — I10 Essential (primary) hypertension: Secondary | ICD-10-CM | POA: Diagnosis not present

## 2019-04-05 DIAGNOSIS — Z792 Long term (current) use of antibiotics: Secondary | ICD-10-CM | POA: Diagnosis not present

## 2019-04-05 DIAGNOSIS — I959 Hypotension, unspecified: Secondary | ICD-10-CM | POA: Diagnosis not present

## 2019-04-06 DIAGNOSIS — I350 Nonrheumatic aortic (valve) stenosis: Secondary | ICD-10-CM | POA: Diagnosis not present

## 2019-04-06 DIAGNOSIS — Z792 Long term (current) use of antibiotics: Secondary | ICD-10-CM | POA: Diagnosis not present

## 2019-04-06 DIAGNOSIS — R7881 Bacteremia: Secondary | ICD-10-CM | POA: Diagnosis not present

## 2019-04-06 DIAGNOSIS — B955 Unspecified streptococcus as the cause of diseases classified elsewhere: Secondary | ICD-10-CM | POA: Diagnosis not present

## 2019-04-07 DIAGNOSIS — R7881 Bacteremia: Secondary | ICD-10-CM | POA: Diagnosis not present

## 2019-04-08 DIAGNOSIS — R7881 Bacteremia: Secondary | ICD-10-CM | POA: Diagnosis not present

## 2019-04-09 DIAGNOSIS — R7881 Bacteremia: Secondary | ICD-10-CM | POA: Diagnosis not present

## 2019-04-10 DIAGNOSIS — R7881 Bacteremia: Secondary | ICD-10-CM | POA: Diagnosis not present

## 2019-04-11 DIAGNOSIS — R7881 Bacteremia: Secondary | ICD-10-CM | POA: Diagnosis not present

## 2019-04-12 DIAGNOSIS — E1165 Type 2 diabetes mellitus with hyperglycemia: Secondary | ICD-10-CM | POA: Diagnosis not present

## 2019-04-12 DIAGNOSIS — E785 Hyperlipidemia, unspecified: Secondary | ICD-10-CM | POA: Diagnosis not present

## 2019-04-12 DIAGNOSIS — Z452 Encounter for adjustment and management of vascular access device: Secondary | ICD-10-CM | POA: Diagnosis not present

## 2019-04-12 DIAGNOSIS — B954 Other streptococcus as the cause of diseases classified elsewhere: Secondary | ICD-10-CM | POA: Diagnosis not present

## 2019-04-12 DIAGNOSIS — I352 Nonrheumatic aortic (valve) stenosis with insufficiency: Secondary | ICD-10-CM | POA: Diagnosis not present

## 2019-04-12 DIAGNOSIS — I959 Hypotension, unspecified: Secondary | ICD-10-CM | POA: Diagnosis not present

## 2019-04-12 DIAGNOSIS — I10 Essential (primary) hypertension: Secondary | ICD-10-CM | POA: Diagnosis not present

## 2019-04-12 DIAGNOSIS — Z792 Long term (current) use of antibiotics: Secondary | ICD-10-CM | POA: Diagnosis not present

## 2019-04-12 DIAGNOSIS — R7881 Bacteremia: Secondary | ICD-10-CM | POA: Diagnosis not present

## 2019-04-12 DIAGNOSIS — Z720 Tobacco use: Secondary | ICD-10-CM | POA: Diagnosis not present

## 2019-04-13 DIAGNOSIS — R7881 Bacteremia: Secondary | ICD-10-CM | POA: Diagnosis not present

## 2019-04-14 DIAGNOSIS — E785 Hyperlipidemia, unspecified: Secondary | ICD-10-CM | POA: Diagnosis not present

## 2019-04-14 DIAGNOSIS — I352 Nonrheumatic aortic (valve) stenosis with insufficiency: Secondary | ICD-10-CM | POA: Diagnosis not present

## 2019-04-14 DIAGNOSIS — R7881 Bacteremia: Secondary | ICD-10-CM | POA: Diagnosis not present

## 2019-04-14 DIAGNOSIS — Z452 Encounter for adjustment and management of vascular access device: Secondary | ICD-10-CM | POA: Diagnosis not present

## 2019-04-14 DIAGNOSIS — I10 Essential (primary) hypertension: Secondary | ICD-10-CM | POA: Diagnosis not present

## 2019-04-14 DIAGNOSIS — E1165 Type 2 diabetes mellitus with hyperglycemia: Secondary | ICD-10-CM | POA: Diagnosis not present

## 2019-04-14 DIAGNOSIS — Z720 Tobacco use: Secondary | ICD-10-CM | POA: Diagnosis not present

## 2019-04-14 DIAGNOSIS — Z792 Long term (current) use of antibiotics: Secondary | ICD-10-CM | POA: Diagnosis not present

## 2019-04-14 DIAGNOSIS — B954 Other streptococcus as the cause of diseases classified elsewhere: Secondary | ICD-10-CM | POA: Diagnosis not present

## 2019-04-14 DIAGNOSIS — I959 Hypotension, unspecified: Secondary | ICD-10-CM | POA: Diagnosis not present

## 2019-04-15 DIAGNOSIS — R7881 Bacteremia: Secondary | ICD-10-CM | POA: Diagnosis not present

## 2019-04-16 DIAGNOSIS — Z792 Long term (current) use of antibiotics: Secondary | ICD-10-CM | POA: Diagnosis not present

## 2019-05-04 DIAGNOSIS — R69 Illness, unspecified: Secondary | ICD-10-CM | POA: Diagnosis not present

## 2019-05-12 DIAGNOSIS — C4442 Squamous cell carcinoma of skin of scalp and neck: Secondary | ICD-10-CM | POA: Diagnosis not present

## 2019-05-25 DIAGNOSIS — D044 Carcinoma in situ of skin of scalp and neck: Secondary | ICD-10-CM | POA: Diagnosis not present

## 2019-06-09 DIAGNOSIS — R69 Illness, unspecified: Secondary | ICD-10-CM | POA: Diagnosis not present

## 2019-06-28 DIAGNOSIS — I351 Nonrheumatic aortic (valve) insufficiency: Secondary | ICD-10-CM | POA: Diagnosis not present

## 2019-06-28 DIAGNOSIS — I350 Nonrheumatic aortic (valve) stenosis: Secondary | ICD-10-CM | POA: Diagnosis not present

## 2019-06-28 DIAGNOSIS — I10 Essential (primary) hypertension: Secondary | ICD-10-CM | POA: Diagnosis not present

## 2019-07-09 DIAGNOSIS — H52223 Regular astigmatism, bilateral: Secondary | ICD-10-CM | POA: Diagnosis not present

## 2019-07-09 DIAGNOSIS — H524 Presbyopia: Secondary | ICD-10-CM | POA: Diagnosis not present

## 2019-07-09 DIAGNOSIS — H25813 Combined forms of age-related cataract, bilateral: Secondary | ICD-10-CM | POA: Diagnosis not present

## 2019-07-09 DIAGNOSIS — H35341 Macular cyst, hole, or pseudohole, right eye: Secondary | ICD-10-CM | POA: Diagnosis not present

## 2019-07-09 DIAGNOSIS — H5202 Hypermetropia, left eye: Secondary | ICD-10-CM | POA: Diagnosis not present

## 2019-07-09 DIAGNOSIS — Z0100 Encounter for examination of eyes and vision without abnormal findings: Secondary | ICD-10-CM | POA: Diagnosis not present

## 2019-07-09 DIAGNOSIS — H35351 Cystoid macular degeneration, right eye: Secondary | ICD-10-CM | POA: Diagnosis not present

## 2019-09-16 DIAGNOSIS — L821 Other seborrheic keratosis: Secondary | ICD-10-CM | POA: Diagnosis not present

## 2019-09-16 DIAGNOSIS — L570 Actinic keratosis: Secondary | ICD-10-CM | POA: Diagnosis not present

## 2019-09-16 DIAGNOSIS — L814 Other melanin hyperpigmentation: Secondary | ICD-10-CM | POA: Diagnosis not present

## 2019-10-13 DIAGNOSIS — M9902 Segmental and somatic dysfunction of thoracic region: Secondary | ICD-10-CM | POA: Diagnosis not present

## 2019-10-13 DIAGNOSIS — M9904 Segmental and somatic dysfunction of sacral region: Secondary | ICD-10-CM | POA: Diagnosis not present

## 2019-10-13 DIAGNOSIS — M9903 Segmental and somatic dysfunction of lumbar region: Secondary | ICD-10-CM | POA: Diagnosis not present

## 2019-10-13 DIAGNOSIS — M9905 Segmental and somatic dysfunction of pelvic region: Secondary | ICD-10-CM | POA: Diagnosis not present

## 2019-10-15 DIAGNOSIS — M9904 Segmental and somatic dysfunction of sacral region: Secondary | ICD-10-CM | POA: Diagnosis not present

## 2019-10-15 DIAGNOSIS — M9905 Segmental and somatic dysfunction of pelvic region: Secondary | ICD-10-CM | POA: Diagnosis not present

## 2019-10-15 DIAGNOSIS — M9902 Segmental and somatic dysfunction of thoracic region: Secondary | ICD-10-CM | POA: Diagnosis not present

## 2019-10-15 DIAGNOSIS — M9903 Segmental and somatic dysfunction of lumbar region: Secondary | ICD-10-CM | POA: Diagnosis not present

## 2019-10-20 DIAGNOSIS — M9904 Segmental and somatic dysfunction of sacral region: Secondary | ICD-10-CM | POA: Diagnosis not present

## 2019-10-20 DIAGNOSIS — M9905 Segmental and somatic dysfunction of pelvic region: Secondary | ICD-10-CM | POA: Diagnosis not present

## 2019-10-20 DIAGNOSIS — M9903 Segmental and somatic dysfunction of lumbar region: Secondary | ICD-10-CM | POA: Diagnosis not present

## 2019-10-20 DIAGNOSIS — M9902 Segmental and somatic dysfunction of thoracic region: Secondary | ICD-10-CM | POA: Diagnosis not present

## 2019-10-22 DIAGNOSIS — M9904 Segmental and somatic dysfunction of sacral region: Secondary | ICD-10-CM | POA: Diagnosis not present

## 2019-10-22 DIAGNOSIS — M9902 Segmental and somatic dysfunction of thoracic region: Secondary | ICD-10-CM | POA: Diagnosis not present

## 2019-10-22 DIAGNOSIS — M9903 Segmental and somatic dysfunction of lumbar region: Secondary | ICD-10-CM | POA: Diagnosis not present

## 2019-10-22 DIAGNOSIS — M9905 Segmental and somatic dysfunction of pelvic region: Secondary | ICD-10-CM | POA: Diagnosis not present

## 2019-11-12 DIAGNOSIS — R05 Cough: Secondary | ICD-10-CM | POA: Diagnosis not present

## 2019-11-12 DIAGNOSIS — R9431 Abnormal electrocardiogram [ECG] [EKG]: Secondary | ICD-10-CM | POA: Diagnosis not present

## 2019-11-12 DIAGNOSIS — J189 Pneumonia, unspecified organism: Secondary | ICD-10-CM | POA: Diagnosis not present

## 2019-11-12 DIAGNOSIS — R438 Other disturbances of smell and taste: Secondary | ICD-10-CM | POA: Diagnosis not present

## 2019-11-12 DIAGNOSIS — Z20828 Contact with and (suspected) exposure to other viral communicable diseases: Secondary | ICD-10-CM | POA: Diagnosis not present

## 2019-11-12 DIAGNOSIS — R5383 Other fatigue: Secondary | ICD-10-CM | POA: Diagnosis not present

## 2019-11-15 DIAGNOSIS — N179 Acute kidney failure, unspecified: Secondary | ICD-10-CM | POA: Diagnosis not present

## 2019-11-15 DIAGNOSIS — R042 Hemoptysis: Secondary | ICD-10-CM | POA: Diagnosis not present

## 2019-11-15 DIAGNOSIS — R0902 Hypoxemia: Secondary | ICD-10-CM | POA: Diagnosis not present

## 2019-11-15 DIAGNOSIS — J189 Pneumonia, unspecified organism: Secondary | ICD-10-CM | POA: Diagnosis not present

## 2019-11-15 DIAGNOSIS — B348 Other viral infections of unspecified site: Secondary | ICD-10-CM | POA: Diagnosis not present

## 2019-11-15 DIAGNOSIS — I313 Pericardial effusion (noninflammatory): Secondary | ICD-10-CM | POA: Diagnosis not present

## 2019-11-15 DIAGNOSIS — U071 COVID-19: Secondary | ICD-10-CM | POA: Diagnosis not present

## 2019-11-15 DIAGNOSIS — Z79899 Other long term (current) drug therapy: Secondary | ICD-10-CM | POA: Diagnosis not present

## 2019-11-15 DIAGNOSIS — Z7984 Long term (current) use of oral hypoglycemic drugs: Secondary | ICD-10-CM | POA: Diagnosis not present

## 2019-11-15 DIAGNOSIS — Z794 Long term (current) use of insulin: Secondary | ICD-10-CM | POA: Diagnosis not present

## 2019-11-15 DIAGNOSIS — E870 Hyperosmolality and hypernatremia: Secondary | ICD-10-CM | POA: Diagnosis not present

## 2019-11-15 DIAGNOSIS — R778 Other specified abnormalities of plasma proteins: Secondary | ICD-10-CM | POA: Diagnosis not present

## 2019-11-15 DIAGNOSIS — J9601 Acute respiratory failure with hypoxia: Secondary | ICD-10-CM | POA: Diagnosis not present

## 2019-11-15 DIAGNOSIS — I10 Essential (primary) hypertension: Secondary | ICD-10-CM | POA: Diagnosis not present

## 2019-11-15 DIAGNOSIS — Z7982 Long term (current) use of aspirin: Secondary | ICD-10-CM | POA: Diagnosis not present

## 2019-11-15 DIAGNOSIS — E1165 Type 2 diabetes mellitus with hyperglycemia: Secondary | ICD-10-CM | POA: Diagnosis not present

## 2019-11-15 DIAGNOSIS — I352 Nonrheumatic aortic (valve) stenosis with insufficiency: Secondary | ICD-10-CM | POA: Diagnosis not present

## 2019-11-15 DIAGNOSIS — R000 Tachycardia, unspecified: Secondary | ICD-10-CM | POA: Diagnosis not present

## 2019-11-15 DIAGNOSIS — E785 Hyperlipidemia, unspecified: Secondary | ICD-10-CM | POA: Diagnosis not present

## 2019-11-15 DIAGNOSIS — Z9911 Dependence on respirator [ventilator] status: Secondary | ICD-10-CM | POA: Diagnosis not present

## 2019-11-15 DIAGNOSIS — I340 Nonrheumatic mitral (valve) insufficiency: Secondary | ICD-10-CM | POA: Diagnosis not present

## 2019-11-15 DIAGNOSIS — J80 Acute respiratory distress syndrome: Secondary | ICD-10-CM | POA: Diagnosis not present

## 2019-11-15 DIAGNOSIS — I119 Hypertensive heart disease without heart failure: Secondary | ICD-10-CM | POA: Diagnosis not present

## 2019-11-15 DIAGNOSIS — Z8701 Personal history of pneumonia (recurrent): Secondary | ICD-10-CM | POA: Diagnosis not present

## 2019-11-15 DIAGNOSIS — I361 Nonrheumatic tricuspid (valve) insufficiency: Secondary | ICD-10-CM | POA: Diagnosis not present

## 2019-11-15 DIAGNOSIS — I350 Nonrheumatic aortic (valve) stenosis: Secondary | ICD-10-CM | POA: Diagnosis not present

## 2019-11-15 DIAGNOSIS — J9600 Acute respiratory failure, unspecified whether with hypoxia or hypercapnia: Secondary | ICD-10-CM | POA: Diagnosis not present

## 2019-11-15 DIAGNOSIS — E119 Type 2 diabetes mellitus without complications: Secondary | ICD-10-CM | POA: Diagnosis not present

## 2019-11-15 DIAGNOSIS — R918 Other nonspecific abnormal finding of lung field: Secondary | ICD-10-CM | POA: Diagnosis not present

## 2019-11-15 DIAGNOSIS — R739 Hyperglycemia, unspecified: Secondary | ICD-10-CM | POA: Diagnosis not present

## 2019-11-15 DIAGNOSIS — J181 Lobar pneumonia, unspecified organism: Secondary | ICD-10-CM | POA: Diagnosis not present

## 2019-11-16 DIAGNOSIS — R778 Other specified abnormalities of plasma proteins: Secondary | ICD-10-CM

## 2019-11-16 DIAGNOSIS — I10 Essential (primary) hypertension: Secondary | ICD-10-CM | POA: Diagnosis not present

## 2019-11-16 DIAGNOSIS — J189 Pneumonia, unspecified organism: Secondary | ICD-10-CM

## 2019-11-16 DIAGNOSIS — J9600 Acute respiratory failure, unspecified whether with hypoxia or hypercapnia: Secondary | ICD-10-CM

## 2019-11-16 DIAGNOSIS — I119 Hypertensive heart disease without heart failure: Secondary | ICD-10-CM

## 2019-11-16 DIAGNOSIS — E119 Type 2 diabetes mellitus without complications: Secondary | ICD-10-CM | POA: Diagnosis not present

## 2019-11-16 DIAGNOSIS — I350 Nonrheumatic aortic (valve) stenosis: Secondary | ICD-10-CM

## 2019-11-17 DIAGNOSIS — R778 Other specified abnormalities of plasma proteins: Secondary | ICD-10-CM | POA: Diagnosis not present

## 2019-11-17 DIAGNOSIS — I119 Hypertensive heart disease without heart failure: Secondary | ICD-10-CM | POA: Diagnosis not present

## 2019-11-17 DIAGNOSIS — I10 Essential (primary) hypertension: Secondary | ICD-10-CM | POA: Diagnosis not present

## 2019-11-17 DIAGNOSIS — J9600 Acute respiratory failure, unspecified whether with hypoxia or hypercapnia: Secondary | ICD-10-CM | POA: Diagnosis not present

## 2019-11-17 DIAGNOSIS — E119 Type 2 diabetes mellitus without complications: Secondary | ICD-10-CM | POA: Diagnosis not present

## 2019-11-17 DIAGNOSIS — I350 Nonrheumatic aortic (valve) stenosis: Secondary | ICD-10-CM | POA: Diagnosis not present

## 2019-11-17 DIAGNOSIS — J189 Pneumonia, unspecified organism: Secondary | ICD-10-CM | POA: Diagnosis not present

## 2019-11-18 DIAGNOSIS — J9600 Acute respiratory failure, unspecified whether with hypoxia or hypercapnia: Secondary | ICD-10-CM | POA: Diagnosis not present

## 2019-11-18 DIAGNOSIS — R778 Other specified abnormalities of plasma proteins: Secondary | ICD-10-CM | POA: Diagnosis not present

## 2019-11-18 DIAGNOSIS — I10 Essential (primary) hypertension: Secondary | ICD-10-CM | POA: Diagnosis not present

## 2019-11-18 DIAGNOSIS — E119 Type 2 diabetes mellitus without complications: Secondary | ICD-10-CM | POA: Diagnosis not present

## 2019-11-19 DIAGNOSIS — J9600 Acute respiratory failure, unspecified whether with hypoxia or hypercapnia: Secondary | ICD-10-CM | POA: Diagnosis not present

## 2019-11-19 DIAGNOSIS — R778 Other specified abnormalities of plasma proteins: Secondary | ICD-10-CM | POA: Diagnosis not present

## 2019-11-19 DIAGNOSIS — I10 Essential (primary) hypertension: Secondary | ICD-10-CM | POA: Diagnosis not present

## 2019-11-19 DIAGNOSIS — E119 Type 2 diabetes mellitus without complications: Secondary | ICD-10-CM | POA: Diagnosis not present

## 2019-11-20 ENCOUNTER — Inpatient Hospital Stay (HOSPITAL_COMMUNITY)
Admission: AD | Admit: 2019-11-20 | Discharge: 2019-12-02 | DRG: 870 | Disposition: E | Payer: Medicare HMO | Source: Other Acute Inpatient Hospital | Attending: Critical Care Medicine | Admitting: Critical Care Medicine

## 2019-11-20 ENCOUNTER — Inpatient Hospital Stay (HOSPITAL_COMMUNITY): Payer: Medicare HMO

## 2019-11-20 ENCOUNTER — Encounter (HOSPITAL_COMMUNITY): Payer: Self-pay | Admitting: Pulmonary Disease

## 2019-11-20 ENCOUNTER — Other Ambulatory Visit: Payer: Self-pay

## 2019-11-20 DIAGNOSIS — Z4682 Encounter for fitting and adjustment of non-vascular catheter: Secondary | ICD-10-CM | POA: Diagnosis not present

## 2019-11-20 DIAGNOSIS — Z4659 Encounter for fitting and adjustment of other gastrointestinal appliance and device: Secondary | ICD-10-CM

## 2019-11-20 DIAGNOSIS — I82451 Acute embolism and thrombosis of right peroneal vein: Secondary | ICD-10-CM | POA: Diagnosis not present

## 2019-11-20 DIAGNOSIS — J181 Lobar pneumonia, unspecified organism: Secondary | ICD-10-CM | POA: Diagnosis not present

## 2019-11-20 DIAGNOSIS — I472 Ventricular tachycardia: Secondary | ICD-10-CM | POA: Diagnosis not present

## 2019-11-20 DIAGNOSIS — E8770 Fluid overload, unspecified: Secondary | ICD-10-CM | POA: Diagnosis not present

## 2019-11-20 DIAGNOSIS — J189 Pneumonia, unspecified organism: Secondary | ICD-10-CM | POA: Diagnosis not present

## 2019-11-20 DIAGNOSIS — R000 Tachycardia, unspecified: Secondary | ICD-10-CM | POA: Diagnosis not present

## 2019-11-20 DIAGNOSIS — J159 Unspecified bacterial pneumonia: Secondary | ICD-10-CM | POA: Diagnosis not present

## 2019-11-20 DIAGNOSIS — G9341 Metabolic encephalopathy: Secondary | ICD-10-CM | POA: Diagnosis not present

## 2019-11-20 DIAGNOSIS — D6489 Other specified anemias: Secondary | ICD-10-CM | POA: Diagnosis not present

## 2019-11-20 DIAGNOSIS — J9600 Acute respiratory failure, unspecified whether with hypoxia or hypercapnia: Secondary | ICD-10-CM | POA: Diagnosis not present

## 2019-11-20 DIAGNOSIS — I469 Cardiac arrest, cause unspecified: Secondary | ICD-10-CM | POA: Diagnosis not present

## 2019-11-20 DIAGNOSIS — R6520 Severe sepsis without septic shock: Secondary | ICD-10-CM | POA: Diagnosis present

## 2019-11-20 DIAGNOSIS — I350 Nonrheumatic aortic (valve) stenosis: Secondary | ICD-10-CM

## 2019-11-20 DIAGNOSIS — R778 Other specified abnormalities of plasma proteins: Secondary | ICD-10-CM | POA: Diagnosis not present

## 2019-11-20 DIAGNOSIS — I248 Other forms of acute ischemic heart disease: Secondary | ICD-10-CM | POA: Diagnosis not present

## 2019-11-20 DIAGNOSIS — A419 Sepsis, unspecified organism: Secondary | ICD-10-CM | POA: Diagnosis not present

## 2019-11-20 DIAGNOSIS — Z0189 Encounter for other specified special examinations: Secondary | ICD-10-CM

## 2019-11-20 DIAGNOSIS — Z452 Encounter for adjustment and management of vascular access device: Secondary | ICD-10-CM

## 2019-11-20 DIAGNOSIS — N170 Acute kidney failure with tubular necrosis: Secondary | ICD-10-CM | POA: Diagnosis not present

## 2019-11-20 DIAGNOSIS — I10 Essential (primary) hypertension: Secondary | ICD-10-CM | POA: Diagnosis not present

## 2019-11-20 DIAGNOSIS — I352 Nonrheumatic aortic (valve) stenosis with insufficiency: Secondary | ICD-10-CM | POA: Diagnosis not present

## 2019-11-20 DIAGNOSIS — R0602 Shortness of breath: Secondary | ICD-10-CM | POA: Diagnosis not present

## 2019-11-20 DIAGNOSIS — J9601 Acute respiratory failure with hypoxia: Secondary | ICD-10-CM | POA: Diagnosis not present

## 2019-11-20 DIAGNOSIS — Z978 Presence of other specified devices: Secondary | ICD-10-CM

## 2019-11-20 DIAGNOSIS — R0902 Hypoxemia: Secondary | ICD-10-CM

## 2019-11-20 DIAGNOSIS — E875 Hyperkalemia: Secondary | ICD-10-CM | POA: Diagnosis not present

## 2019-11-20 DIAGNOSIS — E785 Hyperlipidemia, unspecified: Secondary | ICD-10-CM | POA: Diagnosis present

## 2019-11-20 DIAGNOSIS — U071 COVID-19: Secondary | ICD-10-CM | POA: Diagnosis not present

## 2019-11-20 DIAGNOSIS — E870 Hyperosmolality and hypernatremia: Secondary | ICD-10-CM | POA: Diagnosis not present

## 2019-11-20 DIAGNOSIS — J9690 Respiratory failure, unspecified, unspecified whether with hypoxia or hypercapnia: Secondary | ICD-10-CM

## 2019-11-20 DIAGNOSIS — J90 Pleural effusion, not elsewhere classified: Secondary | ICD-10-CM | POA: Diagnosis not present

## 2019-11-20 DIAGNOSIS — R918 Other nonspecific abnormal finding of lung field: Secondary | ICD-10-CM | POA: Diagnosis not present

## 2019-11-20 DIAGNOSIS — I82461 Acute embolism and thrombosis of right calf muscular vein: Secondary | ICD-10-CM | POA: Diagnosis not present

## 2019-11-20 DIAGNOSIS — I4901 Ventricular fibrillation: Secondary | ICD-10-CM | POA: Diagnosis not present

## 2019-11-20 DIAGNOSIS — J1289 Other viral pneumonia: Secondary | ICD-10-CM | POA: Diagnosis not present

## 2019-11-20 DIAGNOSIS — Z9911 Dependence on respirator [ventilator] status: Secondary | ICD-10-CM | POA: Diagnosis not present

## 2019-11-20 DIAGNOSIS — N179 Acute kidney failure, unspecified: Secondary | ICD-10-CM

## 2019-11-20 DIAGNOSIS — E1165 Type 2 diabetes mellitus with hyperglycemia: Secondary | ICD-10-CM | POA: Diagnosis not present

## 2019-11-20 DIAGNOSIS — J80 Acute respiratory distress syndrome: Secondary | ICD-10-CM

## 2019-11-20 DIAGNOSIS — B348 Other viral infections of unspecified site: Secondary | ICD-10-CM | POA: Diagnosis not present

## 2019-11-20 DIAGNOSIS — E119 Type 2 diabetes mellitus without complications: Secondary | ICD-10-CM | POA: Diagnosis not present

## 2019-11-20 DIAGNOSIS — D696 Thrombocytopenia, unspecified: Secondary | ICD-10-CM | POA: Diagnosis not present

## 2019-11-20 DIAGNOSIS — Z794 Long term (current) use of insulin: Secondary | ICD-10-CM | POA: Diagnosis not present

## 2019-11-20 DIAGNOSIS — R739 Hyperglycemia, unspecified: Secondary | ICD-10-CM | POA: Diagnosis not present

## 2019-11-20 HISTORY — DX: Hyperlipidemia, unspecified: E78.5

## 2019-11-20 HISTORY — DX: Type 2 diabetes mellitus without complications: E11.9

## 2019-11-20 HISTORY — DX: Streptococcal infection, unspecified site: A49.1

## 2019-11-20 HISTORY — DX: Nonrheumatic aortic (valve) insufficiency: I35.1

## 2019-11-20 HISTORY — DX: Nonrheumatic aortic (valve) stenosis: I35.0

## 2019-11-20 HISTORY — DX: Essential (primary) hypertension: I10

## 2019-11-20 LAB — URINALYSIS, ROUTINE W REFLEX MICROSCOPIC
Bilirubin Urine: NEGATIVE
Glucose, UA: >=500 mg/dL — AB
Ketones, ur: NEGATIVE mg/dL
Leukocytes,Ua: NEGATIVE
Nitrite: NEGATIVE
Protein, ur: 30 mg/dL — AB
Specific Gravity, Urine: 1.011 (ref 1.005–1.030)
pH: 5 (ref 5.0–8.0)

## 2019-11-20 LAB — CBC WITH DIFFERENTIAL/PLATELET
Abs Immature Granulocytes: 0.22 10*3/uL — ABNORMAL HIGH (ref 0.00–0.07)
Basophils Absolute: 0 10*3/uL (ref 0.0–0.1)
Basophils Relative: 0 %
Eosinophils Absolute: 0 10*3/uL (ref 0.0–0.5)
Eosinophils Relative: 0 %
HCT: 43.1 % (ref 39.0–52.0)
Hemoglobin: 14.2 g/dL (ref 13.0–17.0)
Immature Granulocytes: 1 %
Lymphocytes Relative: 1 %
Lymphs Abs: 0.5 10*3/uL — ABNORMAL LOW (ref 0.7–4.0)
MCH: 29 pg (ref 26.0–34.0)
MCHC: 32.9 g/dL (ref 30.0–36.0)
MCV: 88.1 fL (ref 80.0–100.0)
Monocytes Absolute: 1.6 10*3/uL — ABNORMAL HIGH (ref 0.1–1.0)
Monocytes Relative: 5 %
Neutro Abs: 29.1 10*3/uL — ABNORMAL HIGH (ref 1.7–7.7)
Neutrophils Relative %: 93 %
Platelets: 382 10*3/uL (ref 150–400)
RBC: 4.89 MIL/uL (ref 4.22–5.81)
RDW: 13.1 % (ref 11.5–15.5)
WBC: 31.4 10*3/uL — ABNORMAL HIGH (ref 4.0–10.5)
nRBC: 0 % (ref 0.0–0.2)

## 2019-11-20 LAB — COMPREHENSIVE METABOLIC PANEL
ALT: 51 U/L — ABNORMAL HIGH (ref 0–44)
AST: 35 U/L (ref 15–41)
Albumin: 3.2 g/dL — ABNORMAL LOW (ref 3.5–5.0)
Alkaline Phosphatase: 171 U/L — ABNORMAL HIGH (ref 38–126)
Anion gap: 16 — ABNORMAL HIGH (ref 5–15)
BUN: 28 mg/dL — ABNORMAL HIGH (ref 8–23)
CO2: 24 mmol/L (ref 22–32)
Calcium: 8.7 mg/dL — ABNORMAL LOW (ref 8.9–10.3)
Chloride: 101 mmol/L (ref 98–111)
Creatinine, Ser: 1.33 mg/dL — ABNORMAL HIGH (ref 0.61–1.24)
GFR calc Af Amer: 59 mL/min — ABNORMAL LOW (ref >60)
GFR calc non Af Amer: 51 mL/min — ABNORMAL LOW (ref >60)
Glucose, Bld: 357 mg/dL — ABNORMAL HIGH (ref 70–99)
Potassium: 3.9 mmol/L (ref 3.5–5.1)
Sodium: 141 mmol/L (ref 135–145)
Total Bilirubin: 1.6 mg/dL — ABNORMAL HIGH (ref 0.3–1.2)
Total Protein: 7.2 g/dL (ref 6.5–8.1)

## 2019-11-20 LAB — GLUCOSE, CAPILLARY
Glucose-Capillary: 389 mg/dL — ABNORMAL HIGH (ref 70–99)
Glucose-Capillary: 335 mg/dL — ABNORMAL HIGH (ref 70–99)

## 2019-11-20 LAB — BLOOD GAS, ARTERIAL
Acid-base deficit: 1.7 mmol/L (ref 0.0–2.0)
Bicarbonate: 23.9 mmol/L (ref 20.0–28.0)
FIO2: 80
MECHVT: 390 mL
O2 Saturation: 99.1 %
Patient temperature: 98
RATE: 30 resp/min
pCO2 arterial: 46.4 mmHg (ref 32.0–48.0)
pH, Arterial: 7.33 — ABNORMAL LOW (ref 7.350–7.450)
pO2, Arterial: 214 mmHg — ABNORMAL HIGH (ref 83.0–108.0)

## 2019-11-20 LAB — MAGNESIUM: Magnesium: 2.2 mg/dL (ref 1.7–2.4)

## 2019-11-20 LAB — HEMOGLOBIN A1C
Hgb A1c MFr Bld: 7.6 % — ABNORMAL HIGH (ref 4.8–5.6)
Mean Plasma Glucose: 171.42 mg/dL

## 2019-11-20 LAB — TRIGLYCERIDES: Triglycerides: 106 mg/dL (ref <150)

## 2019-11-20 LAB — MRSA PCR SCREENING: MRSA by PCR: NEGATIVE

## 2019-11-20 LAB — EXPECTORATED SPUTUM ASSESSMENT W GRAM STAIN, RFLX TO RESP C

## 2019-11-20 LAB — STREP PNEUMONIAE URINARY ANTIGEN: Strep Pneumo Urinary Antigen: NEGATIVE

## 2019-11-20 LAB — BRAIN NATRIURETIC PEPTIDE: B Natriuretic Peptide: 525.5 pg/mL — ABNORMAL HIGH (ref 0.0–100.0)

## 2019-11-20 LAB — PHOSPHORUS: Phosphorus: 3.9 mg/dL (ref 2.5–4.6)

## 2019-11-20 MED ORDER — ETOMIDATE 2 MG/ML IV SOLN
20.0000 mg | Freq: Once | INTRAVENOUS | Status: AC
  Administered 2019-11-20: 15:00:00 20 mg via INTRAVENOUS

## 2019-11-20 MED ORDER — CHLORHEXIDINE GLUCONATE 0.12% ORAL RINSE (MEDLINE KIT)
15.0000 mL | Freq: Two times a day (BID) | OROMUCOSAL | Status: DC
  Administered 2019-11-21: 15 mL via OROMUCOSAL

## 2019-11-20 MED ORDER — INSULIN ASPART 100 UNIT/ML ~~LOC~~ SOLN
0.0000 [IU] | Freq: Three times a day (TID) | SUBCUTANEOUS | Status: DC
  Administered 2019-11-20: 15 [IU] via SUBCUTANEOUS
  Administered 2019-11-21: 8 [IU] via SUBCUTANEOUS
  Administered 2019-11-21: 5 [IU] via SUBCUTANEOUS

## 2019-11-20 MED ORDER — FENTANYL CITRATE (PF) 100 MCG/2ML IJ SOLN
25.0000 ug | INTRAMUSCULAR | Status: DC | PRN
  Administered 2019-11-20 – 2019-11-22 (×6): 50 ug via INTRAVENOUS

## 2019-11-20 MED ORDER — SODIUM CHLORIDE 0.9 % IV SOLN
INTRAVENOUS | Status: DC | PRN
  Administered 2019-11-20: 1000 mL via INTRAVENOUS

## 2019-11-20 MED ORDER — SODIUM CHLORIDE 0.9 % IV SOLN
0.2500 mg/kg/h | INTRAVENOUS | Status: DC
  Administered 2019-11-20: 0.25 mg/kg/h via INTRAVENOUS
  Filled 2019-11-20: qty 5

## 2019-11-20 MED ORDER — ENOXAPARIN SODIUM 40 MG/0.4ML ~~LOC~~ SOLN
40.0000 mg | Freq: Every day | SUBCUTANEOUS | Status: DC
  Administered 2019-11-20: 40 mg via SUBCUTANEOUS
  Filled 2019-11-20: qty 0.4

## 2019-11-20 MED ORDER — VITAL HIGH PROTEIN PO LIQD
1000.0000 mL | ORAL | Status: DC
  Administered 2019-11-20 – 2019-11-21 (×2): 1000 mL

## 2019-11-20 MED ORDER — MIDAZOLAM HCL 2 MG/2ML IJ SOLN
1.0000 mg | INTRAMUSCULAR | Status: DC | PRN
  Administered 2019-11-29: 1 mg via INTRAVENOUS
  Filled 2019-11-20 (×2): qty 2

## 2019-11-20 MED ORDER — MIDAZOLAM HCL 2 MG/2ML IJ SOLN
1.0000 mg | INTRAMUSCULAR | Status: DC | PRN
  Administered 2019-11-20 (×2): 1 mg via INTRAVENOUS
  Filled 2019-11-20: qty 2

## 2019-11-20 MED ORDER — ONDANSETRON HCL 4 MG/2ML IJ SOLN: 4.0000 mg | Freq: Four times a day (QID) | INTRAMUSCULAR | Status: DC | PRN

## 2019-11-20 MED ORDER — ORAL CARE MOUTH RINSE: 15.0000 mL | Freq: Two times a day (BID) | OROMUCOSAL | Status: DC

## 2019-11-20 MED ORDER — CLONIDINE HCL 0.1 MG PO TABS
0.2000 mg | ORAL_TABLET | Freq: Two times a day (BID) | ORAL | Status: DC
  Filled 2019-11-20: qty 2

## 2019-11-20 MED ORDER — VANCOMYCIN HCL 1500 MG/300ML IV SOLN
1500.0000 mg | Freq: Once | INTRAVENOUS | Status: AC
  Administered 2019-11-20: 1500 mg via INTRAVENOUS
  Filled 2019-11-20: qty 300

## 2019-11-20 MED ORDER — SODIUM CHLORIDE 0.9 % IV SOLN
2.0000 g | Freq: Two times a day (BID) | INTRAVENOUS | Status: DC
  Administered 2019-11-20 – 2019-11-21 (×2): 2 g via INTRAVENOUS
  Filled 2019-11-20 (×2): qty 2

## 2019-11-20 MED ORDER — FENTANYL 2500MCG IN NS 250ML (10MCG/ML) PREMIX INFUSION
0.0000 ug/h | INTRAVENOUS | Status: DC
  Administered 2019-11-20: 25 ug/h via INTRAVENOUS
  Administered 2019-11-21 – 2019-11-22 (×2): 150 ug/h via INTRAVENOUS
  Filled 2019-11-20 (×3): qty 250

## 2019-11-20 MED ORDER — ALBUTEROL SULFATE (2.5 MG/3ML) 0.083% IN NEBU: 2.5000 mg | INHALATION_SOLUTION | RESPIRATORY_TRACT | Status: DC | PRN

## 2019-11-20 MED ORDER — FENTANYL CITRATE (PF) 100 MCG/2ML IJ SOLN
100.0000 ug | Freq: Once | INTRAMUSCULAR | Status: AC
  Administered 2019-11-20: 100 ug via INTRAVENOUS

## 2019-11-20 MED ORDER — FENTANYL CITRATE (PF) 100 MCG/2ML IJ SOLN
INTRAMUSCULAR | Status: AC
  Filled 2019-11-20: qty 2

## 2019-11-20 MED ORDER — FUROSEMIDE 10 MG/ML IJ SOLN
40.0000 mg | Freq: Four times a day (QID) | INTRAMUSCULAR | Status: AC
  Administered 2019-11-20 (×2): 40 mg via INTRAVENOUS
  Filled 2019-11-20 (×2): qty 4

## 2019-11-20 MED ORDER — ROCURONIUM BROMIDE 50 MG/5ML IV SOLN
100.0000 mg | Freq: Once | INTRAVENOUS | Status: DC
  Filled 2019-11-20: qty 10

## 2019-11-20 MED ORDER — INSULIN ASPART 100 UNIT/ML ~~LOC~~ SOLN
0.0000 [IU] | Freq: Every day | SUBCUTANEOUS | Status: DC
  Administered 2019-11-20: 4 [IU] via SUBCUTANEOUS

## 2019-11-20 MED ORDER — PROPOFOL 1000 MG/100ML IV EMUL
5.0000 ug/kg/min | INTRAVENOUS | Status: DC
  Administered 2019-11-20: 5 ug/kg/min via INTRAVENOUS

## 2019-11-20 MED ORDER — MIDAZOLAM HCL 2 MG/2ML IJ SOLN
4.0000 mg | Freq: Once | INTRAMUSCULAR | Status: AC
  Administered 2019-11-20: 4 mg via INTRAVENOUS

## 2019-11-20 MED ORDER — MIDAZOLAM HCL 2 MG/2ML IJ SOLN
INTRAMUSCULAR | Status: AC
  Filled 2019-11-20: qty 4

## 2019-11-20 MED ORDER — METOPROLOL TARTRATE 5 MG/5ML IV SOLN
5.0000 mg | Freq: Four times a day (QID) | INTRAVENOUS | Status: DC
  Administered 2019-11-20: 5 mg via INTRAVENOUS
  Filled 2019-11-20: qty 5

## 2019-11-20 MED ORDER — SPIRONOLACTONE 25 MG PO TABS: 25.0000 mg | ORAL_TABLET | Freq: Every day | ORAL | Status: DC

## 2019-11-20 MED ORDER — FAMOTIDINE IN NACL 20-0.9 MG/50ML-% IV SOLN: 20.0000 mg | Freq: Every day | INTRAVENOUS | Status: DC

## 2019-11-20 MED ORDER — VANCOMYCIN HCL 1250 MG/250ML IV SOLN
1250.0000 mg | INTRAVENOUS | Status: DC
  Filled 2019-11-20: qty 250

## 2019-11-20 MED ORDER — FENTANYL CITRATE (PF) 100 MCG/2ML IJ SOLN: 25.0000 ug | INTRAMUSCULAR | Status: DC | PRN

## 2019-11-20 MED ORDER — ORAL CARE MOUTH RINSE
15.0000 mL | OROMUCOSAL | Status: DC
  Administered 2019-11-20 – 2019-11-30 (×99): 15 mL via OROMUCOSAL

## 2019-11-20 MED ORDER — CHLORHEXIDINE GLUCONATE CLOTH 2 % EX PADS
6.0000 | MEDICATED_PAD | Freq: Every day | CUTANEOUS | Status: DC
  Administered 2019-11-20 – 2019-11-29 (×10): 6 via TOPICAL

## 2019-11-20 MED ORDER — ASPIRIN EC 81 MG PO TBEC
81.0000 mg | DELAYED_RELEASE_TABLET | Freq: Every day | ORAL | Status: DC
  Administered 2019-11-21 – 2019-11-22 (×2): 81 mg via ORAL
  Filled 2019-11-20 (×2): qty 1

## 2019-11-20 MED ORDER — VASOPRESSIN 20 UNIT/ML IV SOLN
0.0300 [IU]/min | INTRAVENOUS | Status: DC
  Filled 2019-11-20: qty 2

## 2019-11-20 MED ORDER — METOPROLOL TARTRATE 5 MG/5ML IV SOLN
2.5000 mg | Freq: Four times a day (QID) | INTRAVENOUS | Status: DC
  Administered 2019-11-20 – 2019-11-21 (×2): 2.5 mg via INTRAVENOUS
  Filled 2019-11-20 (×2): qty 5

## 2019-11-20 MED ORDER — METOPROLOL TARTRATE 25 MG PO TABS: 25.0000 mg | ORAL_TABLET | Freq: Two times a day (BID) | ORAL | Status: DC

## 2019-11-20 MED ORDER — PRO-STAT SUGAR FREE PO LIQD
30.0000 mL | Freq: Two times a day (BID) | ORAL | Status: DC
  Administered 2019-11-20 – 2019-11-22 (×4): 30 mL
  Filled 2019-11-20 (×5): qty 30

## 2019-11-20 MED ORDER — ATORVASTATIN CALCIUM 10 MG PO TABS
20.0000 mg | ORAL_TABLET | Freq: Every day | ORAL | Status: DC
  Administered 2019-11-20: 20 mg via ORAL
  Filled 2019-11-20: qty 2

## 2019-11-20 MED ORDER — CHLORHEXIDINE GLUCONATE 0.12% ORAL RINSE (MEDLINE KIT)
15.0000 mL | Freq: Two times a day (BID) | OROMUCOSAL | Status: DC
  Administered 2019-11-21 – 2019-11-30 (×18): 15 mL via OROMUCOSAL

## 2019-11-20 MED ORDER — PROPOFOL 1000 MG/100ML IV EMUL
0.0000 ug/kg/min | INTRAVENOUS | Status: DC
  Administered 2019-11-20: 20 ug/kg/min via INTRAVENOUS
  Administered 2019-11-20: 15 ug/kg/min via INTRAVENOUS
  Administered 2019-11-21: 25 ug/kg/min via INTRAVENOUS
  Administered 2019-11-21 – 2019-11-23 (×4): 15 ug/kg/min via INTRAVENOUS
  Administered 2019-11-23: 5 ug/kg/min via INTRAVENOUS
  Administered 2019-11-24 (×2): 20 ug/kg/min via INTRAVENOUS
  Administered 2019-11-24: 15 ug/kg/min via INTRAVENOUS
  Administered 2019-11-25: 19:00:00 25 ug/kg/min via INTRAVENOUS
  Administered 2019-11-25: 10 ug/kg/min via INTRAVENOUS
  Administered 2019-11-26: 14:00:00 20 ug/kg/min via INTRAVENOUS
  Administered 2019-11-26: 25 ug/kg/min via INTRAVENOUS
  Administered 2019-11-27: 15 ug/kg/min via INTRAVENOUS
  Administered 2019-11-28: 1 ug/kg/min via INTRAVENOUS
  Administered 2019-11-28 – 2019-11-29 (×2): 5 ug/kg/min via INTRAVENOUS
  Filled 2019-11-20 (×17): qty 100

## 2019-11-20 MED ORDER — ROCURONIUM BROMIDE 50 MG/5ML IV SOLN
70.0000 mg | Freq: Once | INTRAVENOUS | Status: AC
  Administered 2019-11-20: 70 mg via INTRAVENOUS
  Filled 2019-11-20: qty 7

## 2019-11-20 MED ORDER — ORAL CARE MOUTH RINSE
15.0000 mL | OROMUCOSAL | Status: DC
  Administered 2019-11-20: 15 mL via OROMUCOSAL

## 2019-11-20 MED ORDER — ACETAMINOPHEN 325 MG PO TABS: 650.0000 mg | ORAL_TABLET | ORAL | Status: DC | PRN

## 2019-11-20 MED ORDER — FREE WATER
200.0000 mL | Status: DC
  Administered 2019-11-20 – 2019-11-21 (×5): 200 mL

## 2019-11-20 MED ORDER — BENZONATATE 100 MG PO CAPS: 200.0000 mg | ORAL_CAPSULE | Freq: Three times a day (TID) | ORAL | Status: DC | PRN

## 2019-11-20 NOTE — Procedures (Signed)
Central Venous Catheter Insertion Procedure Note Manley Tunks [id number] Sep 22, 1940  Procedure: Insertion of Central Venous Catheter Indications: Assessment of intravascular volume  Procedure Details Consent: Risks of procedure as well as the alternatives and risks of each were explained to the (patient/caregiver).  Consent for procedure obtained. Time Out: Verified patient identification, verified procedure, site/side was marked, verified correct patient position, special equipment/implants available, medications/allergies/relevent history reviewed, required imaging and test results available.  Performed  Maximum sterile technique was used including antiseptics, cap, gloves, gown, hand hygiene, mask and sheet. Skin prep: Chlorhexidine; local anesthetic administered A antimicrobial bonded/coated triple lumen catheter was placed in the left internal jugular vein using the Seldinger technique.  Left subclavian vein initially attempted, could not access vein.  Ultrasound was used to verify the patency of the vein and for real time needle guidance.  Evaluation Blood flow good Complications: No apparent complications Patient did tolerate procedure well. Chest X-ray ordered to verify placement.  CXR: pending.  Roselie Awkward 11/11/2019, 3:56 PM

## 2019-11-20 NOTE — Procedures (Signed)
PCCM Video Bronchoscopy Procedure Note  The patient was informed of the risks (including but not limited to bleeding, infection, respiratory failure, lung injury, tooth/oral injury) and benefits of the procedure and gave consent, see chart.  Indication: acute respiratory failure with hypoxemia, bilateral pneumonia of uncertain origin  Post Procedure Diagnosis: same  Location: [facility] ICU  Condition pre procedure: critically ill, on vent  Medications for procedure: fentanyl, propofol, versed, rocuronium as used for intubation (see chart)  Procedure description: The bronchoscope was introduced through the endotracheal tube and passed to the bilateral lungs to the level of the subsegmental bronchi throughout the tracheobronchial tree.  Airway exam revealed that the ETT was near but above the carina, there were thick, bloody secretions bilaterally, particularly in the lower lobes.  No masses, no airway inflammation appreciated.    Procedures performed: BAL in the medial segment of RML, 60cc sterile saline injected, 32 cc bloody return.  Specimens sent: culture: bacterial, fungus, AFB  Condition post procedure: critically ill, on vent  EBL: none from procedure  Complications: none immediate  Roselie Awkward, MD [place] PCCM Pager: [phone] Cell: [phone] If no response, call [phone]

## 2019-11-20 NOTE — Procedures (Signed)
Intubation Procedure Note Rameez Garthwaite [id number] 1941-04-06  Procedure: Intubation Indications: Respiratory insufficiency  Procedure Details Consent: Risks of procedure as well as the alternatives and risks of each were explained to the (patient/caregiver).  Consent for procedure obtained. Time Out: Verified patient identification, verified procedure, site/side was marked, verified correct patient position, special equipment/implants available, medications/allergies/relevent history reviewed, required imaging and test results available.  Performed  Drugs versed 2mg , fentanyl 64mcg, etomidate 20mg , rocuronium 70mg  DL x 1 with GS 4 blade Grade 1 view 8.0 ET tube passed through cords under direct visualization Placement confirmed with bilateral breath sounds, positive EtCO2 change and smoke in tube   Evaluation Hemodynamic Status: BP stable throughout; O2 sats: transiently fell during during procedure Patient's Current Condition: stable Complications: No apparent complications Patient did tolerate procedure well. Chest X-ray ordered to verify placement.  CXR: pending.   Roselie Awkward 11/09/2019

## 2019-11-20 NOTE — Progress Notes (Signed)
Abg obtained on the following vent settings: PRVC, 390ML, RR30, 80%, +12 peep.  Results for Jeffrey Obrien, Jeffrey Obrien (MRN [id number]) as of 11/09/2019 22:32  Ref. Range 11/10/2019 22:00  Delivery systems Unknown VENTILATOR  FIO2 Unknown 80.00  Mode Unknown PRESSURE REGULATED VOLUME CONTROL  VT Latest Units: mL 390  pH, Arterial Latest Ref Range: 7.350 - 7.450  7.330 (L)  pCO2 arterial Latest Ref Range: 32.0 - 48.0 mmHg 46.4  pO2, Arterial Latest Ref Range: 83.0 - 108.0 mmHg 214 (H)  Acid-base deficit Latest Ref Range: 0.0 - 2.0 mmol/L 1.7  Bicarbonate Latest Ref Range: 20.0 - 28.0 mmol/L 23.9  O2 Saturation Latest Units: % 99.1  Patient temperature Unknown 98.0  Collection site Unknown RIGHT RADIAL  Allens test (pass/fail) Latest Ref Range: PASS  PASS

## 2019-11-20 NOTE — Progress Notes (Signed)
Pt proned at 1815. RT at bedside to assist 5RNs with prone. Pt tolerated well

## 2019-11-20 NOTE — Progress Notes (Signed)
eLink Physician-Brief Progress Note Patient Name: Jeffrey Obrien DOB: July 08, 1941 MRN: [id number]   Date of Service  11/04/2019  HPI/Events of Note  Pt is hypotensive as well as dyssynchronous on the ventilator.  eICU Interventions  Ketamine 0.25 mg/ hr infusion added for better sedation without impact on blood pressure, Propofol to be weaned once Ketamine helps achieve RAS of -2 to -3, this should improve BP. Lopressor reduced to 2.5 mg iv Q 6 hours,  PEEP increased to 12 and RR increased to 30. Rocuronium 100  mg iv x 1.        Frederik Pear 11/03/2019, 8:35 PM

## 2019-11-20 NOTE — Progress Notes (Signed)
Pharmacy Antibiotic Note  Jeffrey Obrien is a 79 y.o. male presented to [facility] on 3/15 with SOB. At [facility], he was started in azithromycin and ceftriaxone and then changed to zosyn. He transferred to [facility] on 11/03/2019  for management of respiratory failure.  Pharmacy was consulted to start vancomycin and cefepime for PNA.  - scr 1.10 on 3/20 at [facility] (crcl~56)  Plan: - vancomycin 1500 mg IV x1, then 1250mg  V q24h for est AUC 509 - cefepime 2 gm IV q12h  ____________________________________  Height: 5\' 7"  (170.2 cm) Weight: 156 lb 8.4 oz (71 kg) IBW/kg (Calculated) : 66.1  Temp (24hrs), Avg:98 F (36.7 C), Min:98 F (36.7 C), Max:98 F (36.7 C)  No results for input(s): WBC, CREATININE, LATICACIDVEN, VANCOTROUGH, VANCOPEAK, VANCORANDOM, GENTTROUGH, GENTPEAK, GENTRANDOM, TOBRATROUGH, TOBRAPEAK, TOBRARND, AMIKACINPEAK, AMIKACINTROU, AMIKACIN in the last 168 hours.  CrCl cannot be calculated (No successful lab value found.).    Not on File  Antimicrobials this admission:  3/15 azithro>>3/20 3/15 CTX>>3/18 3/18 zosyn>> 3/20  3/20 cefepime>> 3/20 vanc>>  Dose adjustments this admission:  --  Microbiology results:  3/16 BCx x2 (at [place]): ngtd 3/18 Sputum (at [place]): normal resp flora 3/20 MRSA PCR:   Thank you for allowing pharmacy to be a part of this patient's care.  Lynelle Doctor 11/27/2019 2:13 PM

## 2019-11-20 NOTE — Progress Notes (Addendum)
[facility] PCCM  Worsening respiratory distress: RR increased, more hypoxemic, increased work of breathing.  Intubated and started on mechanical ventilation Bronchoscopy performed to obtain lower respiratory tract specimen. CVL placed to assist with CVP monitoring and to administer medications needed to keep him sedated on vent. If BAL cultures/stains are negative at 24-48 hours may need inflammatory workup In the fall he had a "blood stream infection" according to his daughter, no clear work up. Will check blood culture.  Given persistent sinus tach and hypertension: change metoprolol to IV, give 7m IV now  Daughter updated bedside.  BRoselie Awkward MD [place] Pager: [phone]: [phone] no response, call [phone]

## 2019-11-20 NOTE — H&P (Signed)
NAME:  Jeffrey Obrien, MRN:  [id number], DOB:  11-Sep-1940, LOS: 0 ADMISSION DATE:  11/11/2019, CONSULTATION DATE:  3/20 REFERRING MD:  [facility], CHIEF COMPLAINT:  Dyspnea   Brief History   79 y/o male admitted on 3/20 from [facility] where he was admitted for pneumonia causing severe acute respiratory failure with hypoxemia on 3/15.    History of present illness   79 y/o male admitted on 3/20 from [facility] where he was admitted for pneumonia causing severe acute respiratory failure with hypoxemia on 3/15.  He says that he doesn't feel any better or worse since admission. He hasn't had a fever, chills that he knows of.  He denies leg swelling or pain of any kind, though he notes that he has had cough and dyspnea.  He was moved here in the setting of severe acute respiratory failure with hypoxemia which was not improving.  At [place] he received some diuretics and antibiotics.  He is coughing up rust colored sputum.  He denies chest pain or ankle swelling.  He was seen by cardiology there who felt he was not in heart failure, but noted that he had severe aortic stenosis and may need to be considered for TAVR at some point.  He had two COVID tests which were negative.     Past Medical History  Aortic stenosis, severe Hypertension DM2   Significant Hospital Events     Consults:  none  Procedures:    Significant Diagnostic Tests:  3/15 CT angiogram chest OSH> no PE, marked severity diffuse bilateral partially ground-glass appearing infiltrates. Small bilateral pleural effusions, R > L. Received his second COVID vaccine 3 weeks prior to admission. 3/16 TTE > Mild to moderate aortic regurgitation, severe aortic stenosis, peak gradient 7mmHg, mild LVH, LVEF 55-60%, LA dilated  Micro Data:  3/15 SARS COV 2 > neg 3/18 SARS COV 2 > neg 3/15 blood culture > neg 3/20 sputum culture >  3/20 urine strep >  3/20 urine legionella >   Antimicrobials:  3/20 vanc >    3/20 cefepime >   Interim history/subjective:  As above  Objective   Blood pressure (!) 164/82, pulse (!) 129, temperature 98 F (36.7 C), temperature source Oral, resp. rate (!) 37, height 5\' 7"  (1.702 m), weight 71 kg, SpO2 92 %.        Intake/Output Summary (Last 24 hours) at 11/10/2019 1348 Last data filed at 11/29/2019 1300 Gross per 24 hour  Intake --  Output 75 ml  Net -75 ml   Filed Weights   11/11/2019 1312  Weight: 71 kg    Examination:  General:  Resting comfortably in bed HENT: NCAT OP clear PULM: Crackles bases, normal effort CV: RRR, systolic murmur RUSB GI: BS+, soft, nontender MSK: normal bulk and tone Neuro: awake, alert, no distress, MAEW   Resolved Hospital Problem list     Assessment & Plan:  Severe acute respiratory failure with hypoxemia in setting of bilateral infiltrates at OSH, elevated WBC there, no fever, high proBNP, CT angiogram negative for PE, and two negative COVID tests ddx Severe CAP, no organism identified, HCAP, with or without some degree of acute pulmonary edema with severe aortic stenosis > admit to ICU > monitor closely for need for intubation > may need bronchoscopy if intubated > check CXR > check resp viral panel > droplet precautions > treat for HCAP with VANC/Cefepime > sputum culture > urine strep/leg antigens > lasix x2 doses  Hypertension > restart  home clonidine > restart home amlodipine > hold home lisinopril after checking BMET > check BMET  Severe aortic stenosis > lasix > tele > spironolactone  DM2 > SSI > Card modified diet  Sinus tachycardia:  > tele > treat pulmonary edema/pneumonia as detailed above  Best practice:  Diet: cardiac healthy Pain/Anxiety/Delirium protocol (if indicated): n/a VAP protocol (if indicated): n/a DVT prophylaxis: lovenox GI prophylaxis: n/a Glucose control: ssi Mobility: bed rest Code Status: full Family Communication: full Disposition: remain in ICU  Labs    CBC: No results for input(s): WBC, NEUTROABS, HGB, HCT, MCV, PLT in the last 168 hours.  Basic Metabolic Panel: No results for input(s): NA, K, CL, CO2, GLUCOSE, BUN, CREATININE, CALCIUM, MG, PHOS in the last 168 hours. GFR: CrCl cannot be calculated (No successful lab value found.). No results for input(s): PROCALCITON, WBC, LATICACIDVEN in the last 168 hours.  Liver Function Tests: No results for input(s): AST, ALT, ALKPHOS, BILITOT, PROT, ALBUMIN in the last 168 hours. No results for input(s): LIPASE, AMYLASE in the last 168 hours. No results for input(s): AMMONIA in the last 168 hours.  ABG No results found for: PHART, PCO2ART, PO2ART, HCO3, TCO2, ACIDBASEDEF, O2SAT   Coagulation Profile: No results for input(s): INR, PROTIME in the last 168 hours.  Cardiac Enzymes: No results for input(s): CKTOTAL, CKMB, CKMBINDEX, TROPONINI in the last 168 hours.  HbA1C: No results found for: HGBA1C  CBG: No results for input(s): GLUCAP in the last 168 hours.  Review of Systems:   Gen: Denies fever, chills, weight change, fatigue, night sweats HEENT: Denies blurred vision, double vision, hearing loss, tinnitus, sinus congestion, rhinorrhea, sore throat, neck stiffness, dysphagia PULM:per HPI CV: Denies chest pain, edema, orthopnea, paroxysmal nocturnal dyspnea, palpitations GI: Denies abdominal pain, nausea, vomiting, diarrhea, hematochezia, melena, constipation, change in bowel habits GU: Denies dysuria, hematuria, polyuria, oliguria, urethral discharge Endocrine: Denies hot or cold intolerance, polyuria, polyphagia or appetite change Derm: Denies rash, dry skin, scaling or peeling skin change Heme: Denies easy bruising, bleeding, bleeding gums Neuro: Denies headache, numbness, weakness, slurred speech, loss of memory or consciousness   Past Medical History  Hypertension Severe aortic stenosis  Surgical History     Social History      Family History   His family history  is not on file.   Allergies Not on File   Home Medications  Prior to Admission medications   Not on File     Critical care time: 45 minutes     Roselie Awkward, MD [place] PCCM Pager: [phone] Cell: [phone] If no response, call [phone]

## 2019-11-20 NOTE — Progress Notes (Signed)
CRITICAL VALUE ALERT  Critical Value:  Ph 7.17, C02 71  Date & Time Notied:  3/20 1625  Provider Notified:Dr McQuaid  Orders Received/Actions taken: No new orders at this time

## 2019-11-21 ENCOUNTER — Inpatient Hospital Stay (HOSPITAL_COMMUNITY): Payer: Medicare HMO

## 2019-11-21 LAB — CBC
HCT: 35.3 % — ABNORMAL LOW (ref 39.0–52.0)
Hemoglobin: 11.4 g/dL — ABNORMAL LOW (ref 13.0–17.0)
MCH: 29.4 pg (ref 26.0–34.0)
MCHC: 32.3 g/dL (ref 30.0–36.0)
MCV: 91 fL (ref 80.0–100.0)
Platelets: 228 10*3/uL (ref 150–400)
RBC: 3.88 MIL/uL — ABNORMAL LOW (ref 4.22–5.81)
RDW: 13.2 % (ref 11.5–15.5)
WBC: 23.9 10*3/uL — ABNORMAL HIGH (ref 4.0–10.5)
nRBC: 0.1 % (ref 0.0–0.2)

## 2019-11-21 LAB — BLOOD GAS, ARTERIAL
Acid-base deficit: 4.2 mmol/L — ABNORMAL HIGH (ref 0.0–2.0)
Acid-base deficit: 3.5 mmol/L — ABNORMAL HIGH (ref 0.0–2.0)
Bicarbonate: 25.7 mmol/L (ref 20.0–28.0)
Bicarbonate: 23.6 mmol/L (ref 20.0–28.0)
Drawn by: 441261
FIO2: 100
FIO2: 50
MECHVT: 500 mL
MECHVT: 390 mL
O2 Saturation: 92.9 %
O2 Saturation: 98.7 %
Patient temperature: 98.6
Patient temperature: 98.6
RATE: 16 resp/min
RATE: 30 resp/min
pCO2 arterial: 71.9 mmHg (ref 32.0–48.0)
pCO2 arterial: 52.8 mmHg — ABNORMAL HIGH (ref 32.0–48.0)
pH, Arterial: 7.179 — CL (ref 7.350–7.450)
pH, Arterial: 7.273 — ABNORMAL LOW (ref 7.350–7.450)
pO2, Arterial: 91.6 mmHg (ref 83.0–108.0)
pO2, Arterial: 120 mmHg — ABNORMAL HIGH (ref 83.0–108.0)

## 2019-11-21 LAB — RESPIRATORY PANEL BY PCR

## 2019-11-21 LAB — BASIC METABOLIC PANEL
Anion gap: 11 (ref 5–15)
Anion gap: 13 (ref 5–15)
BUN: 52 mg/dL — ABNORMAL HIGH (ref 8–23)
BUN: 78 mg/dL — ABNORMAL HIGH (ref 8–23)
CO2: 26 mmol/L (ref 22–32)
CO2: 24 mmol/L (ref 22–32)
Calcium: 8.2 mg/dL — ABNORMAL LOW (ref 8.9–10.3)
Calcium: 8 mg/dL — ABNORMAL LOW (ref 8.9–10.3)
Chloride: 104 mmol/L (ref 98–111)
Chloride: 105 mmol/L (ref 98–111)
Creatinine, Ser: 2.46 mg/dL — ABNORMAL HIGH (ref 0.61–1.24)
Creatinine, Ser: 3.37 mg/dL — ABNORMAL HIGH (ref 0.61–1.24)
GFR calc Af Amer: 28 mL/min — ABNORMAL LOW (ref >60)
GFR calc Af Amer: 19 mL/min — ABNORMAL LOW (ref >60)
GFR calc non Af Amer: 24 mL/min — ABNORMAL LOW (ref >60)
GFR calc non Af Amer: 17 mL/min — ABNORMAL LOW (ref >60)
Glucose, Bld: 256 mg/dL — ABNORMAL HIGH (ref 70–99)
Glucose, Bld: 207 mg/dL — ABNORMAL HIGH (ref 70–99)
Potassium: 4.8 mmol/L (ref 3.5–5.1)
Potassium: 4.3 mmol/L (ref 3.5–5.1)
Sodium: 141 mmol/L (ref 135–145)
Sodium: 142 mmol/L (ref 135–145)

## 2019-11-21 LAB — GLUCOSE, CAPILLARY
Glucose-Capillary: 191 mg/dL — ABNORMAL HIGH (ref 70–99)
Glucose-Capillary: 193 mg/dL — ABNORMAL HIGH (ref 70–99)
Glucose-Capillary: 206 mg/dL — ABNORMAL HIGH (ref 70–99)
Glucose-Capillary: 217 mg/dL — ABNORMAL HIGH (ref 70–99)
Glucose-Capillary: 254 mg/dL — ABNORMAL HIGH (ref 70–99)

## 2019-11-21 LAB — TRIGLYCERIDES: Triglycerides: 112 mg/dL (ref <150)

## 2019-11-21 LAB — PHOSPHORUS
Phosphorus: 7.4 mg/dL — ABNORMAL HIGH (ref 2.5–4.6)
Phosphorus: 7.8 mg/dL — ABNORMAL HIGH (ref 2.5–4.6)

## 2019-11-21 LAB — MAGNESIUM
Magnesium: 2.6 mg/dL — ABNORMAL HIGH (ref 1.7–2.4)
Magnesium: 2.6 mg/dL — ABNORMAL HIGH (ref 1.7–2.4)

## 2019-11-21 MED ORDER — PANTOPRAZOLE SODIUM 40 MG IV SOLR
40.0000 mg | Freq: Every day | INTRAVENOUS | Status: DC
  Administered 2019-11-21: 40 mg via INTRAVENOUS
  Filled 2019-11-21: qty 40

## 2019-11-21 MED ORDER — VANCOMYCIN HCL IN DEXTROSE 1-5 GM/200ML-% IV SOLN: 1000.0000 mg | INTRAVENOUS | Status: DC

## 2019-11-21 MED ORDER — ENOXAPARIN SODIUM 30 MG/0.3ML ~~LOC~~ SOLN
30.0000 mg | Freq: Every day | SUBCUTANEOUS | Status: DC
  Administered 2019-11-21 – 2019-11-24 (×4): 30 mg via SUBCUTANEOUS
  Filled 2019-11-21 (×4): qty 0.3

## 2019-11-21 MED ORDER — SODIUM CHLORIDE 0.9 % IV SOLN
2.0000 g | INTRAVENOUS | Status: DC
  Administered 2019-11-22 – 2019-11-24 (×3): 2 g via INTRAVENOUS
  Filled 2019-11-21 (×3): qty 2

## 2019-11-21 MED ORDER — NOREPINEPHRINE 4 MG/250ML-% IV SOLN: 0.0000 ug/min | INTRAVENOUS | Status: DC

## 2019-11-21 MED ORDER — PANTOPRAZOLE SODIUM 40 MG PO PACK: 40.0000 mg | PACK | ORAL | Status: DC

## 2019-11-21 MED ORDER — INSULIN ASPART 100 UNIT/ML ~~LOC~~ SOLN
3.0000 [IU] | SUBCUTANEOUS | Status: DC
  Administered 2019-11-21 (×2): 6 [IU] via SUBCUTANEOUS
  Administered 2019-11-21: 9 [IU] via SUBCUTANEOUS
  Administered 2019-11-22 (×3): 6 [IU] via SUBCUTANEOUS
  Administered 2019-11-22: 3 [IU] via SUBCUTANEOUS
  Administered 2019-11-22 – 2019-11-23 (×4): 6 [IU] via SUBCUTANEOUS
  Administered 2019-11-23: 3 [IU] via SUBCUTANEOUS
  Administered 2019-11-23 (×2): 6 [IU] via SUBCUTANEOUS
  Administered 2019-11-23: 9 [IU] via SUBCUTANEOUS
  Administered 2019-11-24: 6 [IU] via SUBCUTANEOUS
  Administered 2019-11-24: 3 [IU] via SUBCUTANEOUS

## 2019-11-21 MED ORDER — DEXTROSE 10 % IV SOLN: INTRAVENOUS | Status: DC

## 2019-11-21 MED ORDER — LACTATED RINGERS IV SOLN: INTRAVENOUS | Status: DC

## 2019-11-21 MED ORDER — SODIUM CHLORIDE 0.9 % IV SOLN
0.2100 mg/kg/h | INTRAVENOUS | Status: DC
  Administered 2019-11-21 (×2): 0.21 mg/kg/h via INTRAVENOUS
  Filled 2019-11-21: qty 5

## 2019-11-21 MED ORDER — PANTOPRAZOLE SODIUM 40 MG PO PACK
40.0000 mg | PACK | ORAL | Status: DC
  Administered 2019-11-21 – 2019-11-29 (×9): 40 mg
  Filled 2019-11-21 (×10): qty 20

## 2019-11-21 MED ORDER — INSULIN ASPART 100 UNIT/ML ~~LOC~~ SOLN
2.0000 [IU] | SUBCUTANEOUS | Status: DC
  Administered 2019-11-21 – 2019-11-24 (×17): 2 [IU] via SUBCUTANEOUS

## 2019-11-21 MED ORDER — ATORVASTATIN CALCIUM 10 MG PO TABS
20.0000 mg | ORAL_TABLET | Freq: Every day | ORAL | Status: DC
  Administered 2019-11-21 – 2019-11-30 (×10): 20 mg
  Filled 2019-11-21 (×10): qty 2

## 2019-11-21 MED ORDER — ACETAMINOPHEN 325 MG PO TABS
650.0000 mg | ORAL_TABLET | ORAL | Status: DC | PRN
  Administered 2019-11-29: 650 mg
  Filled 2019-11-21: qty 2

## 2019-11-21 MED ORDER — VANCOMYCIN HCL 1250 MG/250ML IV SOLN
1250.0000 mg | INTRAVENOUS | Status: DC
  Filled 2019-11-21: qty 250

## 2019-11-21 NOTE — Progress Notes (Signed)
Pt had 7 beats Vtach. MD made aware.  STAT BMP ordered.  Labs drawn and sent. awaiting results. Will CTM.

## 2019-11-21 NOTE — Progress Notes (Signed)
Patient returned to supine position. No complications noted. ETT @ 25; Holder replaced.

## 2019-11-21 NOTE — Progress Notes (Signed)
eLink Physician-Brief Progress Note Patient Name: Jeffrey Obrien DOB: Feb 24, 1941 MRN: [id number]   Date of Service  11/21/2019  HPI/Events of Note  Reviewed KUB per RN request.  eICU Interventions  NG tube is in correct position. OK to use.     Intervention Category Intermediate Interventions: Other:  Charlott Rakes 11/21/2019, 7:33 PM

## 2019-11-21 NOTE — Progress Notes (Addendum)
Pharmacy Antibiotic Note  Jeffrey Obrien is a 79 y.o. male presented to [facility] on 3/15 with SOB. At [facility], he was started in azithromycin and ceftriaxone and then changed to zosyn. He transferred to [facility] on 11/25/2019  for management of respiratory failure.  Pharmacy was consulted to start vancomycin and cefepime for PNA.  11/21/2019 Scr 2.46, CrCl ~ 23.32mls/min   Plan: - decrease vancomycin to 1250mg  IV q48h (AUC 511.2) - decrease cefepime to 2gm IV q24h - follow renal function and clinical course  ____________________________________  Height: 5\' 7"  (170.2 cm) Weight: 156 lb 8.4 oz (71 kg) IBW/kg (Calculated) : 66.1  Temp (24hrs), Avg:96.9 F (36.1 C), Min:93.5 F (34.2 C), Max:98 F (36.7 C)  Recent Labs  Lab 11/21/2019 1416 11/21/19 0430  WBC 31.4* 23.9*  CREATININE 1.33* 2.46*    Estimated Creatinine Clearance: 23.5 mL/min (A) (by C-G formula based on SCr of 2.46 mg/dL (H)).    No Known Allergies  Antimicrobials this admission:  3/15 azithro>>3/20 3/15 CTX>>3/18 3/18 zosyn>> 3/20  3/20 cefepime>> 3/20 vanc>>  Dose adjustments this admission:  --  Microbiology results:  3/16 BCx x2 (at [facility]): ngtd 3/18 Sputum (at [place]): normal resp flora 3/20 MRSA PCR:   Thank you for allowing pharmacy to be a part of this patient's care.  Dolly Rias RPh 11/21/2019, 8:11 AM

## 2019-11-21 NOTE — Progress Notes (Signed)
Results of Resp Panel: Human Rhinovirus/enterovirus. Per lab. Will make MD aware when she sees patient.

## 2019-11-21 NOTE — Progress Notes (Signed)
[facility] Progress Note Patient Name: Dickey Schettino DOB: 1941-08-02 MRN: [id number]   Date of Service  11/21/2019  HPI/Events of Note  No stress order prophylaxis.  eICU Interventions  Pantoprazole 40 mg iv Q 24 hours ordered for stress ulcer prophylaxis.        [name] Gayathri Futrell 11/21/2019, 6:20 AM

## 2019-11-21 NOTE — Progress Notes (Signed)
NAME:  Jeffrey Obrien, MRN:  [id number], DOB:  1941/01/01, LOS: 1 ADMISSION DATE:  12/01/2019, CONSULTATION DATE:  3/20 REFERRING MD:  Alferd Patee, CHIEF COMPLAINT:  Dyspnea   Brief History   79 yo male presented to [facility] 3/15 with dyspnea.  Found to have pneumonia.  Developed worsening hypoxia and transferred to [facility].  Viral panel positive for rhinovirus, negative for COVID 19.  Past Medical History  Aortic stenosis, severe Hypertension DM2 Streptococcus mitis bacteremia in Summer 2020   [place] [facility] Events   3/20 transfer from [place]:    Procedures:  ETT 3/20 >> [facility] [place] CVL 3/20 >>  Significant Diagnostic Tests:  3/15 CT angiogram chest OSH> no PE, marked severity diffuse bilateral partially ground-glass appearing infiltrates. Small bilateral pleural effusions, R > L. Received his second COVID vaccine 3 weeks prior to admission. 3/16 TTE > Mild to moderate aortic regurgitation, severe aortic stenosis, peak gradient 25mmHg, mild LVH, LVEF 55-60%, LA dilated  Micro Data:  3/15 SARS COV 2 > neg 3/18 SARS COV 2 > neg 3/15 blood culture > neg 3/20 sputum culture >  3/20 urine strep > negative 3/20 urine legionella >  3/21 RVP > Rhinovirus  Antimicrobials:  3/20 vanc >  3/20 cefepime >   Interim history/subjective:  Remains on vent, sedation.  Objective   Blood pressure (!) 109/55, pulse 95, temperature 98 F (36.7 C), temperature source Axillary, resp. rate (!) 24, height 5\' 7"  (1.702 m), weight 71 kg, SpO2 96 %. CVP:  [6 mmHg-11 mmHg] 6 mmHg  Vent Mode: PRVC FiO2 (%):  [50 %-100 %] 50 % Set Rate:  [16 bmp-30 bmp] 30 bmp Vt Set:  [390 mL-520 mL] 390 mL PEEP:  [5 [id number] cmH20] 12 cmH20 Plateau Pressure:  [20 cmH20-26 cmH20] 22 cmH20   Intake/Output Summary (Last 24 hours) at 11/21/2019 1454 Last data filed at 11/21/2019 1400 Gross per 24 hour  Intake 1892.91 ml  Output 315 ml  Net 1577.91 ml   Filed Weights   11/16/2019  1312  Weight: 71 kg    Examination:  General - sedated Eyes - pupils reactive ENT - ETT in place Cardiac - regular rate/rhythm, 3/6 SM Chest - b/l crackles Abdomen - soft, non tender, + bowel sounds Extremities - no cyanosis, clubbing, or edema Skin - no rashes Neuro - RASS -3  Resolved Hospital Problem list     Assessment & Plan:   Acute hypoxic/hypercapnic respiratory failure 2nd to Rhinovirus pneumonia with probable bacterial superinfection. - goal SpO2 88 to 95% - f/u CXR, ABG  Sepsis from PNA. - day 2 of ABx - pressors to keep MAP > 65  Acute metabolic encephalopathy 2nd to sepsis, respiratory failure. - RASS goal -1 to -2  AKI from ATN in setting of sepsis, hypoxia. - continue IV fluids - f/u BMET  Severe aortic stenosis. Hx of HTN, HLD. - continue ASA, lipitor - hold outpt catapres, lopressor, aldactone, norvasc, lisinopril  DM type II. - SSI  Best practice:  Diet: tube feeds DVT prophylaxis: lovenox GI prophylaxis: protonix Mobility: bed rest Code Status: full Family Communication: updated at bedside Disposition: ICU  Labs    CMP Latest Ref Rng & Units 11/21/2019 11/02/2019  Glucose 70 - 99 mg/dL 256(H) 357(H)  BUN 8 - 23 mg/dL 52(H) 28(H)  Creatinine 0.61 - 1.24 mg/dL 2.46(H) 1.33(H)  Sodium 135 - 145 mmol/L 141 141  Potassium 3.5 - 5.1 mmol/L 4.8 3.9  Chloride 98 - 111 mmol/L  104 101  CO2 22 - 32 mmol/L 26 24  Calcium 8.9 - 10.3 mg/dL 8.2(L) 8.7(L)  Total Protein 6.5 - 8.1 g/dL - 7.2  Total Bilirubin 0.3 - 1.2 mg/dL - 1.6(H)  Alkaline Phos 38 - 126 U/L - 171(H)  AST 15 - 41 U/L - 35  ALT 0 - 44 U/L - 51(H)    CBC Latest Ref Rng & Units 11/21/2019 11/28/2019  WBC 4.0 - 10.5 K/uL 23.9(H) 31.4(H)  Hemoglobin 13.0 - 17.0 g/dL 11.4(L) 14.2  Hematocrit 39.0 - 52.0 % 35.3(L) 43.1  Platelets 150 - 400 K/uL 228 382    ABG    Component Value Date/Time   PHART 7.273 (L) 11/21/2019 1400   PCO2ART 52.8 (H) 11/21/2019 1400   PO2ART 120 (H)  11/21/2019 1400   HCO3 23.6 11/21/2019 1400   ACIDBASEDEF 3.5 (H) 11/21/2019 1400   O2SAT 98.7 11/21/2019 1400    CBG (last 3)  Recent Labs    11/26/2019 2141 11/21/19 0808 11/21/19 1214  GLUCAP 335* 254* 206*    CC time 38 minutes  Chesley Mires, MD [place] 11/21/2019, 3:02 PM

## 2019-11-22 ENCOUNTER — Inpatient Hospital Stay (HOSPITAL_COMMUNITY): Payer: Medicare HMO

## 2019-11-22 DIAGNOSIS — N179 Acute kidney failure, unspecified: Secondary | ICD-10-CM

## 2019-11-22 DIAGNOSIS — B348 Other viral infections of unspecified site: Secondary | ICD-10-CM

## 2019-11-22 LAB — BLOOD GAS, ARTERIAL
Acid-base deficit: 1.4 mmol/L (ref 0.0–2.0)
Bicarbonate: 24.2 mmol/L (ref 20.0–28.0)
FIO2: 30
O2 Saturation: 97.1 %
Patient temperature: 98.6
pCO2 arterial: 47.5 mmHg (ref 32.0–48.0)
pH, Arterial: 7.327 — ABNORMAL LOW (ref 7.350–7.450)
pO2, Arterial: 93.3 mmHg (ref 83.0–108.0)

## 2019-11-22 LAB — GLUCOSE, CAPILLARY
Glucose-Capillary: 155 mg/dL — ABNORMAL HIGH (ref 70–99)
Glucose-Capillary: 168 mg/dL — ABNORMAL HIGH (ref 70–99)
Glucose-Capillary: 190 mg/dL — ABNORMAL HIGH (ref 70–99)
Glucose-Capillary: 153 mg/dL — ABNORMAL HIGH (ref 70–99)
Glucose-Capillary: 134 mg/dL — ABNORMAL HIGH (ref 70–99)

## 2019-11-22 LAB — TRIGLYCERIDES: Triglycerides: 255 mg/dL — ABNORMAL HIGH (ref <150)

## 2019-11-22 LAB — MAGNESIUM: Magnesium: 2.7 mg/dL — ABNORMAL HIGH (ref 1.7–2.4)

## 2019-11-22 LAB — PHOSPHORUS: Phosphorus: 5.9 mg/dL — ABNORMAL HIGH (ref 2.5–4.6)

## 2019-11-22 MED ORDER — HYDROMORPHONE BOLUS VIA INFUSION
1.0000 mg | INTRAVENOUS | Status: DC | PRN
  Administered 2019-11-23: 1 mg via INTRAVENOUS
  Filled 2019-11-22: qty 1

## 2019-11-22 MED ORDER — VITAL AF 1.2 CAL PO LIQD
1000.0000 mL | ORAL | Status: AC
  Administered 2019-11-22 – 2019-11-28 (×5): 1000 mL

## 2019-11-22 MED ORDER — SODIUM CHLORIDE 0.9 % IV SOLN
1.0000 mg/h | INTRAVENOUS | Status: DC
  Administered 2019-11-22 – 2019-11-26 (×4): 1 mg/h via INTRAVENOUS
  Administered 2019-11-27: 3 mg/h via INTRAVENOUS
  Filled 2019-11-22 (×6): qty 5

## 2019-11-22 MED ORDER — ASPIRIN 81 MG PO CHEW
81.0000 mg | CHEWABLE_TABLET | Freq: Every day | ORAL | Status: DC
  Administered 2019-11-23 – 2019-11-30 (×8): 81 mg
  Filled 2019-11-22 (×8): qty 1

## 2019-11-22 MED ORDER — PRO-STAT SUGAR FREE PO LIQD
30.0000 mL | Freq: Three times a day (TID) | ORAL | Status: DC
  Administered 2019-11-22 – 2019-11-29 (×21): 30 mL
  Filled 2019-11-22 (×21): qty 30

## 2019-11-22 NOTE — Progress Notes (Signed)
Initial Nutrition Assessment  DOCUMENTATION CODES:   Not applicable  INTERVENTION:  Vital 1.2 @ 45 ml/hr (1080 ml/day) with 30 ml Pro-stat TID via OGT  Tube feed regimen at goal rate 45 ml/hr (1080 ml/day) and propofol at current rate provides 1765 kcal (103% of estimated needs), 126 grams of protein, and 875 ml free water  Free water flushes per MD  NUTRITION DIAGNOSIS:   Inadequate oral intake related to inability to eat as evidenced by NPO status.   GOAL:   Provide needs based on ASPEN/SCCM guidelines  MONITOR:   Vent status, Labs, Weight trends, I & O's, TF tolerance, Skin  REASON FOR ASSESSMENT:   Ventilator, Consult Enteral/tube feeding initiation and management  ASSESSMENT:  79 year old male with past medical history of severe aortic stenosis, HTN, and DM2 admitted on 3/20 from [facility] where he was admitted for severe acute respiratory failure with hypoxia secondary to  pneumonia on 3/15.  3/15 CT chest - bilateral partially ground glass infiltrates; small bilateral PE 3/16 TEE - mild to moderate aortic regurgitation, severe aortic stenosis 3/20 Intubated  Per notes: -rhinovirus pneumonia with probable bacterial superinfection -sepsis from PNA -acute metabolic encephalopathy 2/2 to sepsis, respiratory failure -AKI from ATN  Current wt 156 lbs No recent wt history for review, per care everywhere pt weighed 73.9 kg (162.58 lbs) on 06/28/19  Patient is currently intubated on ventilator support MV: 11.3 L/min Temp (24hrs), Avg:98.2 F (36.8 C), Min:97.5 F (36.4 C), Max:99.1 F (37.3 C)  Propofol: 6.39 ml/hr providing 169 kcals Medications reviewed and include: SSI, Protonix Drips: Lactated ringers Maxipime Fentanyl 125 mcg Ketamine 0.21 mcg  Labs: CBGs 168-217 x 24 hrs, BUN 78 (H), Cr 3.37 (H), P 5.9 (H), Mg 2.7 (H), TGs 255 (H)  NUTRITION - FOCUSED PHYSICAL EXAM: Deferred   Diet Order:   Diet Order    None      EDUCATION NEEDS:    No education needs have been identified at this time  Skin:  Skin Assessment: Reviewed RN Assessment  Last BM:  3/19  Height:   Ht Readings from Last 1 Encounters:  11/03/2019 5\' 7"  (1.702 m)    Weight:   Wt Readings from Last 1 Encounters:  11/22/19 71 kg    Ideal Body Weight:  67.3 kg  BMI:  Body mass index is 24.52 kg/m.  Estimated Nutritional Needs:   Kcal:  [id number]  Protein:  107-121  Fluid:  >/= 1.7 L/day   Lajuan Lines, RD, LDN Clinical Nutrition After Hours/Weekend Pager # in [place]

## 2019-11-22 NOTE — Progress Notes (Signed)
Abg obtained on the following vent settings: prvc mode, VT=390, rr30, 30%, +10 peep.  Results for Jeffrey Obrien, Jeffrey Obrien (MRN [id number]) as of 11/22/2019 04:05  Ref. Range 11/22/2019 03:00  FIO2 Unknown 30.00  pH, Arterial Latest Ref Range: 7.350 - 7.450  7.327 (L)  pCO2 arterial Latest Ref Range: 32.0 - 48.0 mmHg 47.5  pO2, Arterial Latest Ref Range: 83.0 - 108.0 mmHg 93.3  Acid-base deficit Latest Ref Range: 0.0 - 2.0 mmol/L 1.4  Bicarbonate Latest Ref Range: 20.0 - 28.0 mmol/L 24.2  O2 Saturation Latest Units: % 97.1  Patient temperature Unknown 98.6  Allens test (pass/fail) Latest Ref Range: PASS  PASS

## 2019-11-22 NOTE — Progress Notes (Signed)
RN Dacoda Spallone wasted 62ml of ketamine with RN Eli Phillips

## 2019-11-22 NOTE — Progress Notes (Signed)
NAME:  Jeffrey Obrien, MRN:  [id number], DOB:  December 30, 1940, LOS: 2 ADMISSION DATE:  11/18/2019, CONSULTATION DATE:  3/20 REFERRING MD:  [facility], CHIEF COMPLAINT:  Dyspnea   Brief History   79 y/o male admitted on 3/20 from [facility] where he was admitted for pneumonia causing severe acute respiratory failure with hypoxemia on 3/15.    Past Medical History  Aortic stenosis, severe Hypertension DM2   Significant Hospital Events   3/15 admission [facility] 3/20 admission [facility], intubation, prone position  Consults:  none  Procedures:  3/20 ETT >  3/20 L IJ CVL >   Significant Diagnostic Tests:  3/15 CT angiogram chest OSH> no PE, marked severity diffuse bilateral partially ground-glass appearing infiltrates. Small bilateral pleural effusions, R > L. Received his second COVID vaccine 3 weeks prior to admission. 3/16 TTE > Mild to moderate aortic regurgitation, severe aortic stenosis, peak gradient 50mHg, mild LVH, LVEF 55-60%, LA dilated  Micro Data:  3/15 SARS COV 2 > neg 3/18 SARS COV 2 > neg 3/15 blood culture > neg 3/20 sputum culture > GPC 3/20 urine strep >  3/20 urine legionella >  3/20 BAL >  3/20 BAL fungus >  3/20 BAL AFB >  3/20 blood >  3/21 RVP> rhinovirus  Antimicrobials:  3/20 vanc >  3/20 cefepime >   Interim history/subjective:  No longer prone Oxygenation improved Renal function worse but making urine Failed SBT due to tachypnea  Objective   Blood pressure (!) 145/58, pulse 91, temperature 98 F (36.7 C), temperature source Axillary, resp. rate 15, height _0  (1.702 m), weight 71 kg, SpO2 96 %. CVP:  [6 mmHg-12 mmHg] 12 mmHg  Vent Mode: PRVC FiO2 (%):  [30 %-60 %] 30 % Set Rate:  [30 bmp] 30 bmp Vt Set:  [390 mL] 390 mL PEEP:  [10 cPZW25-85cmH20] 10 cmH20 Plateau Pressure:  [20 cmH20-23 cmH20] 20 cmH20   Intake/Output Summary (Last 24 hours) at 11/22/2019 [id number] data filed at 11/22/2019 0757 Gross per 24 hour  Intake  2265.86 ml  Output 930 ml  Net 1335.86 ml   Filed Weights   11/19/2019 1312 11/22/19 0430  Weight: 71 kg 71 kg    Examination:  General:  In bed on vent HENT: NCAT ETT in place PULM: CTA B, vent supported breathing CV: RRR, no mgr GI: BS+, soft, nontender MSK: normal bulk and tone Neuro: sedated on vent   Resolved Hospital Problem list   Septic shock> resolved  Assessment & Plan:  ARDS from Rhinovirus pneumonia: oxygenation improved, vent mechanics don't favor extubation Continue mechanical ventilation per ARDS protocol Target TVol 6-8cc/kgIBW Target Plateau Pressure < 30cm H20 Target driving pressure less than 15 cm of water Target PaO2 55-65: titrate PEEP/FiO2 per protocol As long as PaO2 to FiO2 ratio is less than 1:150 position in prone position for 16 hours a day Check CVP daily if CVL in place Target CVP less than 4, diurese as necessary Ventilator associated pneumonia prevention protocol 3/22 : wean PEEP, daily SBT, continue supportive care  AKI> worsening, non-oliguric Monitor BMET and UOP Replace electrolytes as needed May need renal consult in AM Continue gentle LR 75cc/hr  HCAP?  Continue cefepime Stop vanc Monitor cultures from 3/20, may stop antibiotics on 3/22 if negative  Hypertension: now soft BP Hold home antihypertensives Monitor BMET and UOP Replace electrolytes as needed  Severe aortic stenosis Lasix Tele Spironolactone  DM2 SSI Carb modified diet  Need for sedation: still  very agitated, severe ventilator dyssynchrony Target RASS -2 to -3 Stop ketamine Continue propofol Change fentanyl to dilaudid   Best practice:  Diet: cardiac healthy Pain/Anxiety/Delirium protocol (if indicated): n/a VAP protocol (if indicated): n/a DVT prophylaxis: lovenox GI prophylaxis: n/a Glucose control: ssi Mobility: bed rest Code Status: full Family Communication: updated daughters bedside on 3/22 Disposition: remain in ICU  Labs    CBC: Recent Labs  Lab 11/07/2019 1416 11/21/19 0430  WBC 31.4* 23.9*  NEUTROABS 29.1*  --   HGB 14.2 11.4*  HCT 43.1 35.3*  MCV 88.1 91.0  PLT 382 103    Basic Metabolic Panel: Recent Labs  Lab 11/13/2019 1416 11/21/19 0430 11/21/19 1720 11/21/19 1834 11/22/19 0300  NA 141 141  --  142  --   K 3.9 4.8  --  4.3  --   CL 101 104  --  105  --   CO2 24 26  --  24  --   GLUCOSE 357* 256*  --  207*  --   BUN 28* 52*  --  78*  --   CREATININE 1.33* 2.46*  --  3.37*  --   CALCIUM 8.7* 8.2*  --  8.0*  --   MG 2.2 2.6* 2.6*  --  2.7*  PHOS 3.9 7.4* 7.8*  --  5.9*   GFR: Estimated Creatinine Clearance: 17.2 mL/min (A) (by C-G formula based on SCr of 3.37 mg/dL (H)). Recent Labs  Lab 11/11/2019 1416 11/21/19 0430  WBC 31.4* 23.9*    Liver Function Tests: Recent Labs  Lab 11/10/2019 1416  AST 35  ALT 51*  ALKPHOS 171*  BILITOT 1.6*  PROT 7.2  ALBUMIN 3.2*   No results for input(s): LIPASE, AMYLASE in the last 168 hours. No results for input(s): AMMONIA in the last 168 hours.  ABG    Component Value Date/Time   PHART 7.327 (L) 11/22/2019 0300   PCO2ART 47.5 11/22/2019 0300   PO2ART 93.3 11/22/2019 0300   HCO3 24.2 11/22/2019 0300   ACIDBASEDEF 1.4 11/22/2019 0300   O2SAT 97.1 11/22/2019 0300     Coagulation Profile: No results for input(s): INR, PROTIME in the last 168 hours.  Cardiac Enzymes: No results for input(s): CKTOTAL, CKMB, CKMBINDEX, TROPONINI in the last 168 hours.  HbA1C: Hgb A1c MFr Bld  Date/Time Value Ref Range Status  11/29/2019 02:16 PM 7.6 (H) 4.8 - 5.6 % Final    Comment:    (NOTE) Pre diabetes:          5.7%-6.4% Diabetes:              >6.4% Glycemic control for   <7.0% adults with diabetes     CBG: Recent Labs  Lab 11/21/19 1526 11/21/19 1944 11/21/19 2353 11/22/19 0401 11/22/19 0731  GLUCAP 191* 193* 217* 155* 168*    Review of Systems:   Gen: Denies fever, chills, weight change, fatigue, night sweats HEENT: Denies  blurred vision, double vision, hearing loss, tinnitus, sinus congestion, rhinorrhea, sore throat, neck stiffness, dysphagia PULM:per HPI CV: Denies chest pain, edema, orthopnea, paroxysmal nocturnal dyspnea, palpitations GI: Denies abdominal pain, nausea, vomiting, diarrhea, hematochezia, melena, constipation, change in bowel habits GU: Denies dysuria, hematuria, polyuria, oliguria, urethral discharge Endocrine: Denies hot or cold intolerance, polyuria, polyphagia or appetite change Derm: Denies rash, dry skin, scaling or peeling skin change Heme: Denies easy bruising, bleeding, bleeding gums Neuro: Denies headache, numbness, weakness, slurred speech, loss of memory or consciousness   Past Medical History  Hypertension Severe  aortic stenosis  Surgical History     Social History   reports that he has never smoked. He has never used smokeless tobacco.   Family History   His family history is not on file.   Allergies No Known Allergies   Home Medications  Prior to Admission medications   Not on File     Critical care time: 40 minutes     Roselie Awkward, MD [place] [facility] Pager: [phone] Cell: [phone] If no response, call [phone]

## 2019-11-23 ENCOUNTER — Inpatient Hospital Stay (HOSPITAL_COMMUNITY): Payer: Medicare HMO

## 2019-11-23 LAB — COMPREHENSIVE METABOLIC PANEL
ALT: 32 U/L (ref 0–44)
AST: 32 U/L (ref 15–41)
Albumin: 2.2 g/dL — ABNORMAL LOW (ref 3.5–5.0)
Alkaline Phosphatase: 71 U/L (ref 38–126)
Anion gap: 8 (ref 5–15)
BUN: 70 mg/dL — ABNORMAL HIGH (ref 8–23)
CO2: 26 mmol/L (ref 22–32)
Calcium: 7.8 mg/dL — ABNORMAL LOW (ref 8.9–10.3)
Chloride: 108 mmol/L (ref 98–111)
Creatinine, Ser: 2.01 mg/dL — ABNORMAL HIGH (ref 0.61–1.24)
GFR calc Af Amer: 36 mL/min — ABNORMAL LOW (ref >60)
GFR calc non Af Amer: 31 mL/min — ABNORMAL LOW (ref >60)
Glucose, Bld: 181 mg/dL — ABNORMAL HIGH (ref 70–99)
Potassium: 3.9 mmol/L (ref 3.5–5.1)
Sodium: 142 mmol/L (ref 135–145)
Total Bilirubin: 0.6 mg/dL (ref 0.3–1.2)
Total Protein: 5.3 g/dL — ABNORMAL LOW (ref 6.5–8.1)

## 2019-11-23 LAB — CULTURE, RESPIRATORY W GRAM STAIN
Culture: NORMAL
Culture: NORMAL

## 2019-11-23 LAB — CBC WITH DIFFERENTIAL/PLATELET
Abs Immature Granulocytes: 0.15 10*3/uL — ABNORMAL HIGH (ref 0.00–0.07)
Basophils Absolute: 0 10*3/uL (ref 0.0–0.1)
Basophils Relative: 0 %
Eosinophils Absolute: 0.7 10*3/uL — ABNORMAL HIGH (ref 0.0–0.5)
Eosinophils Relative: 6 %
HCT: 32.2 % — ABNORMAL LOW (ref 39.0–52.0)
Hemoglobin: 10.2 g/dL — ABNORMAL LOW (ref 13.0–17.0)
Immature Granulocytes: 1 %
Lymphocytes Relative: 7 %
Lymphs Abs: 0.9 10*3/uL (ref 0.7–4.0)
MCH: 29 pg (ref 26.0–34.0)
MCHC: 31.7 g/dL (ref 30.0–36.0)
MCV: 91.5 fL (ref 80.0–100.0)
Monocytes Absolute: 0.8 10*3/uL (ref 0.1–1.0)
Monocytes Relative: 6 %
Neutro Abs: 9.6 10*3/uL — ABNORMAL HIGH (ref 1.7–7.7)
Neutrophils Relative %: 80 %
Platelets: 142 10*3/uL — ABNORMAL LOW (ref 150–400)
RBC: 3.52 MIL/uL — ABNORMAL LOW (ref 4.22–5.81)
RDW: 13.1 % (ref 11.5–15.5)
WBC: 12 10*3/uL — ABNORMAL HIGH (ref 4.0–10.5)
nRBC: 0 % (ref 0.0–0.2)

## 2019-11-23 LAB — TRIGLYCERIDES: Triglycerides: 122 mg/dL (ref <150)

## 2019-11-23 LAB — GLUCOSE, CAPILLARY
Glucose-Capillary: 168 mg/dL — ABNORMAL HIGH (ref 70–99)
Glucose-Capillary: 174 mg/dL — ABNORMAL HIGH (ref 70–99)
Glucose-Capillary: 141 mg/dL — ABNORMAL HIGH (ref 70–99)
Glucose-Capillary: 201 mg/dL — ABNORMAL HIGH (ref 70–99)
Glucose-Capillary: 154 mg/dL — ABNORMAL HIGH (ref 70–99)
Glucose-Capillary: 163 mg/dL — ABNORMAL HIGH (ref 70–99)
Glucose-Capillary: 184 mg/dL — ABNORMAL HIGH (ref 70–99)

## 2019-11-23 NOTE — Progress Notes (Signed)
RN assessed pt. At 0730 finding that it appeared that tube feeds were coming out of his mouth. RN measure OG tube and found that the cm markings changed from 38cm to 44cm RN stopped tube feeds, suctioned oral and ET tube(no tube feed return), advanced the OG tube and ordered a stat KUB. KUB came back normal RN restarted tube feeds.

## 2019-11-23 NOTE — Progress Notes (Signed)
NAME:  Jeffrey Obrien, MRN:  [id number], DOB:  [birth year], LOS: 3 ADMISSION DATE:  11/08/2019, CONSULTATION DATE:  3/20 REFERRING MD:  [facility], CHIEF COMPLAINT:  Dyspnea   Brief History   79 y/o male admitted on 3/20 from [facility] where he was admitted for pneumonia causing severe acute respiratory failure with hypoxemia on 3/15.    Past Medical History  Aortic stenosis, severe Hypertension DM2   Significant Hospital Events   3/15 admission [facility] 3/20 admission [facility], intubation, prone position  Consults:  none  Procedures:  3/20 ETT >  3/20 L IJ CVL >   Significant Diagnostic Tests:  3/15 CT angiogram chest OSH> no PE, marked severity diffuse bilateral partially ground-glass appearing infiltrates. Small bilateral pleural effusions, R > L. Received his second COVID vaccine 3 weeks prior to admission. 3/16 TTE > Mild to moderate aortic regurgitation, severe aortic stenosis, peak gradient 54mHg, mild LVH, LVEF 55-60%, LA dilated  Micro Data:  3/15 SARS COV 2 > neg 3/18 SARS COV 2 > neg 3/15 blood culture > neg 3/20 sputum culture > GPC 3/20 urine strep >  3/20 urine legionella >  3/20 BAL >  3/20 BAL fungus >  3/20 BAL AFB >  3/20 blood >  3/21 RVP> rhinovirus  Antimicrobials:  3/20 vanc >  3/20 cefepime >   Interim history/subjective:  Heavily sedated. FiO2 needs down  Objective   Blood pressure (!) 133/57, pulse 90, temperature 99.6 F (37.6 C), temperature source Axillary, resp. rate (!) 30, height _0  (1.702 m), weight 71 kg, SpO2 100 %. CVP:  [4 mmHg-17 mmHg] 4 mmHg  Vent Mode: PRVC FiO2 (%):  [30 %-40 %] 35 % Set Rate:  [30 bmp] 30 bmp Vt Set:  [390 mL] 390 mL PEEP:  [5 cmH20-10 cmH20] 5 cmH20 Pressure Support:  [5 cmH20] 5 cmH20 Plateau Pressure:  [6 cmH20-21 cmH20] 20 cmH20   Intake/Output Summary (Last 24 hours) at 11/23/2019 0824 Last data filed at 11/23/2019 0600 Gross per 24 hour  Intake 3357.46 ml  Output 1675 ml    Net 1682.46 ml   Filed Weights   11/02/2019 1312 11/22/19 0430 11/23/19 0359  Weight: 71 kg 71 kg 71 kg    Examination:  General: Currently heavily sedated HEENT: No JVD lymphadenopathy is appreciated.  Tube feeding noted in oral pharynx.  Tube feeding on hold. Neuro: Heavily sedated CV: Heart sounds are distant PULM: Decreased breath sounds in the bases Vent pressure regulated volume control FIO2 35% PEEP 5 RATE 30 no overbreathing VT 390 cc  GI: soft, bsx4 active  GU: Extremities: warm/dry,  edema  Skin: no rashes or lesions    Resolved Hospital Problem list   Septic shock> resolved  Assessment & Plan:  ARDS from Rhinovirus pneumonia: oxygenation improved, vent mechanics don't favor extubation ARDS protocol Wean per protocol Monitor CVP currently 13 large fluctuations noted   AKI> worsening, non-oliguric Lab Results  Component Value Date   CREATININE 2.01 (H) 11/23/2019   CREATININE 3.37 (H) 11/21/2019   CREATININE 2.46 (H) 11/21/2019   Recent Labs  Lab 11/21/19 0430 11/21/19 1834 11/23/19 0355  NA 141 142 142  '  Monitor creatinine Currently on lactated Ringer's at 77 Also on D10 with OG tube questionable 5  HCAP?  Currently on cefepime No positive culture data   Hypertension: now soft BP Recent Labs  Lab 11/21/19 0430 11/21/19 1834 11/23/19 0355  K 4.8 4.3 3.9    Hold antihypertensives Replete electrolytes  as needed  Severe aortic stenosis Diuresis as tolerated  DM2 CBG (last 3)  Recent Labs    11/22/19 2312 11/23/19 0316 11/23/19 0742  GLUCAP 168* 174* 141*    Sliding-scale insulin protocol Tube feedings are on hold 11/23/2019 possible misplacement of G-tube  Need for sedation: still very agitated, severe ventilator dyssynchrony Target RASS -2 to -3 Currently on Dilaudid drip with good sedation Continue propofol    Best practice:  Diet: cardiac healthy Pain/Anxiety/Delirium protocol (if indicated): n/a VAP protocol  (if indicated): n/a DVT prophylaxis: lovenox GI prophylaxis: n/a Glucose control: ssi Mobility: bed rest Code Status: full Family Communication:  Disposition: remain in ICU  Labs   CBC: Recent Labs  Lab 11/28/2019 1416 11/21/19 0430 11/23/19 0355  WBC 31.4* 23.9* 12.0*  NEUTROABS 29.1*  --  9.6*  HGB 14.2 11.4* 10.2*  HCT 43.1 35.3* 32.2*  MCV 88.1 91.0 91.5  PLT 382 228 142*    Basic Metabolic Panel: Recent Labs  Lab 11/19/2019 1416 11/21/19 0430 11/21/19 1720 11/21/19 1834 11/22/19 0300 11/23/19 0355  NA 141 141  --  142  --  142  K 3.9 4.8  --  4.3  --  3.9  CL 101 104  --  105  --  108  CO2 24 26  --  24  --  26  GLUCOSE 357* 256*  --  207*  --  181*  BUN 28* 52*  --  78*  --  70*  CREATININE 1.33* 2.46*  --  3.37*  --  2.01*  CALCIUM 8.7* 8.2*  --  8.0*  --  7.8*  MG 2.2 2.6* 2.6*  --  2.7*  --   PHOS 3.9 7.4* 7.8*  --  5.9*  --    GFR: Estimated Creatinine Clearance: 28.8 mL/min (A) (by C-G formula based on SCr of 2.01 mg/dL (H)). Recent Labs  Lab 11/13/2019 1416 11/21/19 0430 11/23/19 0355  WBC 31.4* 23.9* 12.0*    Liver Function Tests: Recent Labs  Lab 11/25/2019 1416 11/23/19 0355  AST 35 32  ALT 51* 32  ALKPHOS 171* 71  BILITOT 1.6* 0.6  PROT 7.2 5.3*  ALBUMIN 3.2* 2.2*   No results for input(s): LIPASE, AMYLASE in the last 168 hours. No results for input(s): AMMONIA in the last 168 hours.  ABG    Component Value Date/Time   PHART 7.327 (L) 11/22/2019 0300   PCO2ART 47.5 11/22/2019 0300   PO2ART 93.3 11/22/2019 0300   HCO3 24.2 11/22/2019 0300   ACIDBASEDEF 1.4 11/22/2019 0300   O2SAT 97.1 11/22/2019 0300     Coagulation Profile: No results for input(s): INR, PROTIME in the last 168 hours.  Cardiac Enzymes: No results for input(s): CKTOTAL, CKMB, CKMBINDEX, TROPONINI in the last 168 hours.  HbA1C: Hgb A1c MFr Bld  Date/Time Value Ref Range Status  11/09/2019 02:16 PM 7.6 (H) 4.8 - 5.6 % Final    Comment:    (NOTE) Pre  diabetes:          5.7%-6.4% Diabetes:              >6.4% Glycemic control for   <7.0% adults with diabetes     CBG: Recent Labs  Lab 11/22/19 1535 11/22/19 1944 11/22/19 2312 11/23/19 0316 11/23/19 0742  GLUCAP 153* 134* 168* 174* 141*    Critical care time: 30 minutes    Richardson Landry Ahyana Skillin ACNP Acute Care Nurse Practitioner [place] Please consult Amion 11/23/2019, 8:24 AM

## 2019-11-23 NOTE — TOC Initial Note (Signed)
Transition of Care [facility]) - Initial/Assessment Note    Patient Details  Name: Jeffrey Obrien MRN: [id number] Date of Birth: [birth year]  Transition of [facility]) CM/SW Contact:    Lennart Pall, LCSW Phone Number: 11/23/2019, 3:19 PM  Clinical Narrative:      Met with pt's spouse, Jeffrey Obrien and daughter, Jeffrey Obrien today to review living arrangements/ support and d/c plans.  Family hopeful pt may be nearing readiness for extubation and pleased to have heard from MD that kidney function is improving.  Pt living with his elderly spouse and family very supportive.  He was completely independent and active prior to this acute medical decline.  Explained that TOC will continue to follow for d/c planning needs.  Plan at time time is for d/c with family providing care if they are able.             Expected Discharge Plan: [place] Barriers to Discharge: Continued Medical Work up   Patient Goals and CMS Choice Patient states their goals for this hospitalization and ongoing recovery are:: "we just want to get him home!"  per spouse      Expected Discharge Plan and Services Expected Discharge Plan: [place] In-house Referral: Clinical Social Work     Living arrangements for the past 2 months: Single Family Home                                      Prior Living Arrangements/Services Living arrangements for the past 2 months: Single Family Home Lives with:: Spouse Patient language and need for interpreter reviewed:: No Do you feel safe going back to the place where you live?: Yes      Need for Family Participation in Patient Care: Yes (Comment) Care giver support system in place?: Yes (comment)   Criminal Activity/Legal Involvement Pertinent to Current Situation/Hospitalization: No - Comment as needed  Activities of Daily Living Home Assistive Devices/Equipment: None ADL Screening (condition at time of admission) Patient's cognitive ability adequate to  safely complete daily activities?: Yes Is the patient deaf or have difficulty hearing?: No Does the patient have difficulty seeing, even when wearing glasses/contacts?: No Does the patient have difficulty concentrating, remembering, or making decisions?: No Patient able to express need for assistance with ADLs?: Yes Does the patient have difficulty dressing or bathing?: No Independently performs ADLs?: Yes (appropriate for developmental age) Does the patient have difficulty walking or climbing stairs?: No Weakness of Legs: None Weakness of Arms/Hands: None  Permission Sought/Granted Permission sought to share information with : Family Supports Permission granted to share information with : Yes, Verbal Permission Granted(spouse)  Share Information with NAME: Levada Dy     Permission granted to share info w Relationship: daughter  Permission granted to share info w Contact Information: [phone]  Emotional Assessment Appearance:: Appears stated age Attitude/Demeanor/Rapport: Unable to Assess, Intubated (Following Commands or Not Following Commands) Affect (typically observed): Unable to Assess   Alcohol / Substance Use: Not Applicable Psych Involvement: No (comment)  Admission diagnosis:  Acute respiratory failure with hypoxemia ([place]) [J96.01] Patient Active Problem List   Diagnosis Date Noted  . Acute respiratory failure with hypoxemia ([place]) 11/11/2019   PCP:  Angelina Sheriff, MD Pharmacy:   [facility] [place] [address] Phone: [phone] Fax: [phone]     Social  Determinants of Health (SDOH) Interventions    Readmission Risk Interventions Readmission Risk Prevention Plan 11/23/2019  Transportation Screening Complete  PCP or Specialist Appt within 5-7 Days Complete  Home Care Screening Complete  Medication Review (RN CM) Complete  Some recent data might be hidden

## 2019-11-23 NOTE — Progress Notes (Signed)
Pt. Primary care provider Dr. Lin Landsman in [place].

## 2019-11-24 ENCOUNTER — Inpatient Hospital Stay (HOSPITAL_COMMUNITY): Payer: Medicare HMO

## 2019-11-24 DIAGNOSIS — I350 Nonrheumatic aortic (valve) stenosis: Secondary | ICD-10-CM

## 2019-11-24 DIAGNOSIS — J9601 Acute respiratory failure with hypoxia: Secondary | ICD-10-CM

## 2019-11-24 LAB — PHOSPHORUS: Phosphorus: 3.3 mg/dL (ref 2.5–4.6)

## 2019-11-24 LAB — APTT
aPTT: 49 seconds — ABNORMAL HIGH (ref 24–36)
aPTT: 57 seconds — ABNORMAL HIGH (ref 24–36)
aPTT: 60 seconds — ABNORMAL HIGH (ref 24–36)

## 2019-11-24 LAB — GLUCOSE, CAPILLARY
Glucose-Capillary: 138 mg/dL — ABNORMAL HIGH (ref 70–99)
Glucose-Capillary: 162 mg/dL — ABNORMAL HIGH (ref 70–99)
Glucose-Capillary: 200 mg/dL — ABNORMAL HIGH (ref 70–99)
Glucose-Capillary: 191 mg/dL — ABNORMAL HIGH (ref 70–99)
Glucose-Capillary: 203 mg/dL — ABNORMAL HIGH (ref 70–99)
Glucose-Capillary: 213 mg/dL — ABNORMAL HIGH (ref 70–99)

## 2019-11-24 LAB — TRIGLYCERIDES: Triglycerides: 100 mg/dL (ref <150)

## 2019-11-24 LAB — ACID FAST CULTURE WITH REFLEXED SENSITIVITIES (MYCOBACTERIA)

## 2019-11-24 LAB — BASIC METABOLIC PANEL
Anion gap: 7 (ref 5–15)
BUN: 59 mg/dL — ABNORMAL HIGH (ref 8–23)
CO2: 26 mmol/L (ref 22–32)
Calcium: 7.9 mg/dL — ABNORMAL LOW (ref 8.9–10.3)
Chloride: 111 mmol/L (ref 98–111)
Creatinine, Ser: 1.46 mg/dL — ABNORMAL HIGH (ref 0.61–1.24)
GFR calc Af Amer: 53 mL/min — ABNORMAL LOW (ref >60)
GFR calc non Af Amer: 46 mL/min — ABNORMAL LOW (ref >60)
Glucose, Bld: 150 mg/dL — ABNORMAL HIGH (ref 70–99)
Potassium: 3.9 mmol/L (ref 3.5–5.1)
Sodium: 144 mmol/L (ref 135–145)

## 2019-11-24 LAB — TROPONIN I (HIGH SENSITIVITY): Troponin I (High Sensitivity): 6886 ng/L (ref <18)

## 2019-11-24 LAB — PROCALCITONIN: Procalcitonin: 0.1 ng/mL

## 2019-11-24 LAB — ECHOCARDIOGRAM COMPLETE
Height: 67 in
Weight: 2504.43 oz

## 2019-11-24 LAB — ACID FAST SMEAR (AFB, MYCOBACTERIA)

## 2019-11-24 LAB — MAGNESIUM: Magnesium: 2.4 mg/dL (ref 1.7–2.4)

## 2019-11-24 LAB — BRAIN NATRIURETIC PEPTIDE: B Natriuretic Peptide: 1034.6 pg/mL — ABNORMAL HIGH (ref 0.0–100.0)

## 2019-11-24 LAB — PROTIME-INR
INR: 1.1 (ref 0.8–1.2)
Prothrombin Time: 14.3 seconds (ref 11.4–15.2)

## 2019-11-24 MED ORDER — SODIUM CHLORIDE 0.9% FLUSH
10.0000 mL | Freq: Two times a day (BID) | INTRAVENOUS | Status: DC
  Administered 2019-11-24: 30 mL
  Administered 2019-11-24 – 2019-11-26 (×5): 10 mL
  Administered 2019-11-26: 30 mL
  Administered 2019-11-27 – 2019-11-28 (×3): 10 mL

## 2019-11-24 MED ORDER — INSULIN ASPART 100 UNIT/ML ~~LOC~~ SOLN
3.0000 [IU] | SUBCUTANEOUS | Status: DC
  Administered 2019-11-24: 9 [IU] via SUBCUTANEOUS
  Administered 2019-11-24: 6 [IU] via SUBCUTANEOUS
  Administered 2019-11-25: 3 [IU] via SUBCUTANEOUS
  Administered 2019-11-25: 6 [IU] via SUBCUTANEOUS
  Administered 2019-11-25 (×3): 9 [IU] via SUBCUTANEOUS
  Administered 2019-11-25 (×2): 6 [IU] via SUBCUTANEOUS
  Administered 2019-11-26: 9 [IU] via SUBCUTANEOUS
  Administered 2019-11-26: 6 [IU] via SUBCUTANEOUS
  Administered 2019-11-26: 9 [IU] via SUBCUTANEOUS
  Administered 2019-11-26: 6 [IU] via SUBCUTANEOUS
  Administered 2019-11-26 – 2019-11-27 (×2): 9 [IU] via SUBCUTANEOUS
  Administered 2019-11-27 (×3): 6 [IU] via SUBCUTANEOUS
  Administered 2019-11-27: 9 [IU] via SUBCUTANEOUS
  Administered 2019-11-27 – 2019-11-28 (×4): 6 [IU] via SUBCUTANEOUS
  Administered 2019-11-28 (×2): 3 [IU] via SUBCUTANEOUS
  Administered 2019-11-28 (×2): 6 [IU] via SUBCUTANEOUS
  Administered 2019-11-29 (×2): 3 [IU] via SUBCUTANEOUS
  Administered 2019-11-29: 6 [IU] via SUBCUTANEOUS

## 2019-11-24 MED ORDER — SODIUM CHLORIDE 0.9 % IV SOLN
250.0000 mL | INTRAVENOUS | Status: DC | PRN
  Administered 2019-11-24: 22:00:00 250 mL via INTRAVENOUS

## 2019-11-24 MED ORDER — METOPROLOL TARTRATE 25 MG/10 ML ORAL SUSPENSION
12.5000 mg | Freq: Two times a day (BID) | ORAL | Status: DC
  Administered 2019-11-24 – 2019-11-26 (×4): 12.5 mg
  Filled 2019-11-24 (×5): qty 5

## 2019-11-24 MED ORDER — ARGATROBAN 50 MG/50ML IV SOLN
0.6000 ug/kg/min | INTRAVENOUS | Status: DC
  Administered 2019-11-24 – 2019-11-25 (×3): 0.6 ug/kg/min via INTRAVENOUS
  Filled 2019-11-24 (×5): qty 50

## 2019-11-24 MED ORDER — SODIUM CHLORIDE 0.9% FLUSH
3.0000 mL | Freq: Two times a day (BID) | INTRAVENOUS | Status: DC
  Administered 2019-11-24 – 2019-11-25 (×4): 3 mL via INTRAVENOUS

## 2019-11-24 MED ORDER — SODIUM CHLORIDE 0.9% FLUSH: 10.0000 mL | INTRAVENOUS | Status: DC | PRN

## 2019-11-24 MED ORDER — METOPROLOL TARTRATE 5 MG/5ML IV SOLN
INTRAVENOUS | Status: AC
  Filled 2019-11-24: qty 5

## 2019-11-24 MED ORDER — SODIUM CHLORIDE 0.9 % IV SOLN
2.0000 g | Freq: Two times a day (BID) | INTRAVENOUS | Status: AC
  Administered 2019-11-24 – 2019-11-27 (×7): 2 g via INTRAVENOUS
  Filled 2019-11-24 (×7): qty 2

## 2019-11-24 MED ORDER — FUROSEMIDE 10 MG/ML IJ SOLN
40.0000 mg | Freq: Once | INTRAMUSCULAR | Status: AC
  Administered 2019-11-24: 40 mg via INTRAVENOUS
  Filled 2019-11-24: qty 4

## 2019-11-24 MED ORDER — ARGATROBAN 50 MG/50ML IV SOLN
0.5000 ug/kg/min | INTRAVENOUS | Status: DC
  Administered 2019-11-24: 13:00:00 0.5 ug/kg/min via INTRAVENOUS
  Filled 2019-11-24: qty 50

## 2019-11-24 MED ORDER — METOPROLOL TARTRATE 5 MG/5ML IV SOLN
3.0000 mg | Freq: Once | INTRAVENOUS | Status: AC
  Administered 2019-11-24: 3 mg via INTRAVENOUS

## 2019-11-24 MED ORDER — SODIUM CHLORIDE 0.9% FLUSH
3.0000 mL | INTRAVENOUS | Status: DC | PRN
  Administered 2019-11-25: 3 mL via INTRAVENOUS

## 2019-11-24 MED ORDER — INSULIN ASPART 100 UNIT/ML ~~LOC~~ SOLN
2.0000 [IU] | SUBCUTANEOUS | Status: DC
  Administered 2019-11-24 – 2019-11-28 (×23): 2 [IU] via SUBCUTANEOUS

## 2019-11-24 NOTE — Progress Notes (Signed)
  Echocardiogram 2D Echocardiogram has been performed.  Jannett Celestine 11/24/2019, 12:32 PM

## 2019-11-24 NOTE — Progress Notes (Addendum)
NAME:  Jeffrey Obrien, MRN:  [id number], DOB:  1941-04-21, LOS: 4 ADMISSION DATE:  11/06/2019, CONSULTATION DATE:  3/20 REFERRING MD:  [facility], CHIEF COMPLAINT:  Dyspnea   Brief History   79 y/o male admitted on 3/20 from [facility] where he was admitted for pneumonia causing severe acute respiratory failure with hypoxemia on 3/15.    Past Medical History  Aortic stenosis, severe Hypertension DM2   has no past medical history on file.   has no past surgical history on file.   Significant Hospital Events   3/15 admission [facility] 3/20 admission [facility], intubation, prone position 3/23- Heavily sedated. FiO2 needs down  Consults:  none  Procedures:  3/20 ETT >  3/20 L IJ CVL >   Significant Diagnostic Tests:  3/15 CT angiogram chest OSH> no PE, marked severity diffuse bilateral partially ground-glass appearing infiltrates. Small bilateral pleural effusions, R > L. Received his second COVID vaccine 3 weeks prior to admission.  3/16 TTE > Mild to moderate aortic regurgitation, severe aortic stenosis, peak gradient 46mHg, mild LVH, LVEF 55-60%, LA dilated  Micro Data:  3/15 SARS COV 2 > neg 3/18 SARS COV 2 > neg 3/15 blood culture > neg 3/20 sputum culture > GPC 3/20 urine strep >  3/20 urine legionella >  3/20 BAL >  3/20 BAL fungus >  3/20 BAL AFB >  3/20 blood >  3/21 RVP> rhinovirus  Antimicrobials:  3/20 vanc >  3/20 cefepime >   Interim history/subjective:   11/24/2019:-Known supine ventilation with 40% FiO2.  On propofol and Dilaudid infusions.  On wake up assessment and spontaneous breathing trial quickly became tachypneic and failed attempted spontaneous breathing trial.  Not on pressors.  Making urine.  Improved Creat. White count coming down.  Objective   Blood pressure 128/61, pulse (!) 109, temperature (!) 97.5 F (36.4 C), temperature source Axillary, resp. rate 14, height _0  (1.702 m), weight 71 kg, SpO2 98 %. CVP:  [3 mmHg-8 mmHg]  7 mmHg  Vent Mode: PRVC FiO2 (%):  [35 %-40 %] 40 % Set Rate:  [30 bmp] 30 bmp Vt Set:  [390 mL] 390 mL PEEP:  [5 cmH20] 5 cmH20 Pressure Support:  [5 cmH20] 5 cmH20 Plateau Pressure:  [16 cmH20-24 cmH20] 22 cmH20   Intake/Output Summary (Last 24 hours) at 11/24/2019 0903 Last data filed at 11/24/2019 0600 Gross per 24 hour  Intake 3208.76 ml  Output 1790 ml  Net 1418.76 ml   Filed Weights   11/22/19 0430 11/23/19 0359 11/24/19 0412  Weight: 71 kg 71 kg 71 kg   Vent Mode: PRVC FiO2 (%):  [35 %-40 %] 40 % Set Rate:  [30 bmp] 30 bmp Vt Set:  [390 mL] 390 mL PEEP:  [5 cmH20] 5 cmH20 Pressure Support:  [5 cmH20] 5 cmH20 Plateau Pressure:  [16 cmH20-24 cmH20] 22 cmH20  Examination: General Appearance:  Looks criticall ill Head:  Normocephalic, without obvious abnormality, atraumatic Eyes:  PERRL - yes, conjunctiva/corneas - muddy     Ears:  Normal external ear canals, both ears Nose:  G tube - no Throat:  ETT TUBE - yes , OG tube - yes Neck:  Supple,  No enlargement/tenderness/nodules Lungs: Clear to auscultation bilaterally, Ventilator   Synchrony - mild dysnchrony + Heart:  S1 and S2 normal, no murmur, CVP - no.  Pressors - no Abdomen:  Soft, no masses, no organomegaly Genitalia / Rectal:  Not done Extremities:  Extremities- intact Skin:  ntact in  exposed areas . Sacral area - not examined Neurologic:  Sedation - diprivan gtt and dilaudid gtt -> RASS - -3 . Moves all 4s - not examined. CAM-ICU - no . Orientation - no       Resolved Hospital Problem list   Septic shock> resolved  Assessment & Plan:  Acute resp failure and ARDS from Rhinovirus pneumonia:   11/24/2019 -improving oxygenation but does not meet criteria for spontaneous breathing trial [in fact failed] and therefore extubation.  No longer needing prone ventilation  plan ARDS protocol Full vent support Wean per protocol Lasix x 1    Need for sedation: still very agitated, severe ventilator  dyssynchrony Target RASS -2 to -3 Currently on Dilaudid drip with good sedation Continue propofol - check CK and lactate    AKI  11/24/2019 - improved  plan Lasix x 1 Monitor Change fluid to KVP  HCAP?   11/24/2019 - no majore fever. Cutlure negative so far  plan cefepime Check PCT and consider dc abx depending on results/course    Hx of Hypertension and Severe aortic stenosis  11/24/2019 - adequate BP but not high. 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 30 DVT prophylaxis: lovenox GI prophylaxis: protonix Glucose control: ssi Mobility: bed rest Code Status: full Family Communication: Wife Jeffrey Obrien [phone]. Per RN - wife visits. Currently not at bedsiide Disposition: remain in [place]   The patient Jeffrey Obrien is critically ill with multiple organ systems failure and requires high complexity decision making for assessment and support,  frequent evaluation and titration of therapies, application of advanced monitoring technologies and extensive interpretation of multiple databases.   Critical Care Time devoted to patient care services described in this note is  45  Minutes. This time reflects time of care of this signee Dr Brand Males. This critical care time does not reflect procedure time, or teaching time or supervisory time of PA/NP/Med student/Med Resident etc but could involve care discussion time     Dr. Brand Males, M.D., [facility].C.P Pulmonary and Critical Care Medicine Staff Physician [place] [facility] Pager: [phone], If no answer or between  15:00h - 7:00h: call [phone]  11/24/2019 9:04 AM  LABS    PULMONARY Recent Labs  Lab 12/01/2019 1547 11/05/2019 2200 11/21/19 1400 11/22/19 0300  PHART 7.179* 7.330* 7.273* 7.327*  PCO2ART 71.9* 46.4 52.8* 47.5  PO2ART 91.6 214* 120* 93.3  HCO3 25.7 23.9 23.6 24.2  O2SAT 92.9 99.1 98.7 97.1    CBC Recent Labs  Lab 11/08/2019 1416 11/21/19 0430 11/23/19 0355  HGB 14.2 11.4* 10.2*  HCT 43.1 35.3* 32.2*  WBC 31.4* 23.9* 12.0*  PLT 382 228 142*    COAGULATION No results for input(s): INR in the last 168 hours.  CARDIAC  No results for input(s): TROPONINI in the last 168 hours. No results for input(s): PROBNP in the last 168 hours.   CHEMISTRY Recent Labs  Lab 11/14/2019 1416 11/07/2019 1416 11/21/19 0430 11/21/19 0430 11/21/19 1720 11/21/19 1834 11/21/19 1834 11/22/19 0300 11/23/19 0355 11/24/19 0412  NA 141  --  141  --   --  142  --   --  142 144  K 3.9   < > 4.8   < >  --  4.3   < >  --  3.9 3.9  CL 101  --  104  --   --  105  --   --  108 111  CO2 24  --  26  --   --  24  --   --  26 26  GLUCOSE 357*  --  256*  --   --  207*  --   --  181* 150*  BUN 28*  --  52*  --   --  78*  --   --  70* 59*  CREATININE 1.33*  --  2.46*  --   --  3.37*  --   --  2.01* 1.46*  CALCIUM 8.7*  --  8.2*   --   --  8.0*  --   --  7.8* 7.9*  MG 2.2  --  2.6*  --  2.6*  --   --  2.7*  --  2.4  PHOS 3.9  --  7.4*  --  7.8*  --   --  5.9*  --  3.3   < > = values in this interval not displayed.   Estimated Creatinine Clearance: 39.6 mL/min (A) (by C-G formula based on SCr of 1.46 mg/dL (H)).   LIVER Recent Labs  Lab 11/24/2019 1416 11/23/19 0355  AST 35 32  ALT 51* 32  ALKPHOS 171* 71  BILITOT 1.6* 0.6  PROT 7.2 5.3*  ALBUMIN 3.2* 2.2*     INFECTIOUS No results for input(s): LATICACIDVEN, PROCALCITON in the last 168 hours.   ENDOCRINE CBG (last 3)  Recent Labs    11/23/19 2323 11/24/19 0333 11/24/19 0736  GLUCAP 184* 138* 162*         IMAGING x48h  - image(s) personally visualized  -   highlighted in bold DG Abd 1 View  Result Date: 11/23/2019 CLINICAL DATA:  Feeding tube repositioning EXAM: ABDOMEN - 1 VIEW COMPARISON:  11/01/2019 FINDINGS: Frontal view of the lower chest and upper abdomen demonstrates enteric catheter projecting over the expected course of the proximal duodenum, tip overlying second portion duodenum. Bowel gas pattern is unremarkable. Persistent interstitial and ground-glass opacities at the lung bases, though improved since prior study. IMPRESSION: 1. Enteric catheter tip projecting over second portion duodenum. 2. Persistent but improving bibasilar interstitial and ground-glass opacities. Electronically Signed   By: Randa Ngo M.D.   On: 11/23/2019 08:31   DG Chest [facility]  1 View  Result Date: 11/24/2019 CLINICAL DATA:  Respiratory failure EXAM: PORTABLE CHEST 1 VIEW COMPARISON:  November 23, 2019 FINDINGS: The heart size and mediastinal contours are unchanged. ETT is 2.7 cm above the level of the carina. NG tube is seen below the diaphragm. A left-sided central venous catheter seen at the brachiocephalic SVC junction. Again noted are multifocal patchy hazy airspace opacities throughout both lungs without significant change. No acute osseous abnormality.  IMPRESSION: No significant interval change in the multifocal airspace patchy opacities. Electronically Signed   By: Prudencio Pair M.D.   On: 11/24/2019 05:58   DG Chest Port 1 View  Result Date: 11/23/2019 CLINICAL DATA:  Respiratory failure EXAM: PORTABLE CHEST 1 VIEW COMPARISON:  Yesterday FINDINGS: The endotracheal tube tip is just below the clavicular heads. Left IJ line with tip at the upper SVC. The enteric tube at least reaches the stomach. Extensive bilateral airspace disease without definite progression. No visible effusion or air leak. Normal heart size when accounting for rotation IMPRESSION: Stable hardware positioning and bilateral airspace disease. Electronically Signed   By: Monte Fantasia M.D.   On: 11/23/2019 05:32

## 2019-11-24 NOTE — Progress Notes (Signed)
Pharmacy - Argatroban  Assessment:    Please see note from Leodis Sias, PharmD earlier today for full details.  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   First aPTT slightly subtherapeutic at 49 sec (goal 50-90 sec) on 0.5 mcg/kg/min  No bleeding or infusion issues per RN  Plan:   Increase argatroban to 0.6 mcg/kg/min  Recheck aPTT in 2 hr  APTT q2 hr until two therapeutic levels  Reuel Boom, PharmD, BCPS [phone] 11/24/2019, 3:48 PM

## 2019-11-24 NOTE — Progress Notes (Signed)
Low dose poral lopressor added - lower than cards recommendation Can do IV prn if more needed overnight If doing well then can up-titrate    SIGNATURE    Dr. Brand Males, M.D., F.C.C.P,  Pulmonary and Critical Care Medicine Staff Physician, [place] Director - Interstitial Lung Disease  Program  Pulmonary [place] at [place], [place], [address]  Pager: [phone], If no answer or between  15:00h - 7:00h: call [phone] Telephone: [phone]  6:19 PM 11/24/2019

## 2019-11-24 NOTE — Progress Notes (Signed)
   Recent Labs  Lab 11/27/2019 1416 11/21/19 0430 11/23/19 0355  PLT 382 228 142*     Duplex  Summary:  RIGHT:  - Findings consistent with acute deep vein thrombosis involving the right  peroneal veins, and right gastrocnemius veins.  - No cystic structure found in the popliteal fossa.    LEFT:  - There is no evidence of deep vein thrombosis in the lower extremity.    *See table(s) above for measurements and observations.   Plan  - await HITT ab  - dc lovenox  - start DTI per pharmacy protocol    SIGNATURE    Dr. Brand Males, M.D., F.C.C.P,  Pulmonary and Critical Care Medicine Staff Physician, [place] Director - Interstitial Lung Disease  Program  Pulmonary [place] at [place], [place], [address]  Pager: [phone], If no answer or between  15:00h - 7:00h: call [phone] Telephone: [phone]  10:27 AM 11/24/2019

## 2019-11-24 NOTE — Progress Notes (Signed)
CRITICAL VALUE ALERT  Critical Value:  Troponin 6,886  Date & Time Notied:  11/24/19 10:30  Provider Notified: Chase Caller  Orders Received/Actions taken: Stat EKG

## 2019-11-24 NOTE — Progress Notes (Signed)
Brief cardiology note. Full consult to follow in the AM.  Called by Dr. Chase Caller for assistance with management. Chart reviewed. Elevated troponin to nearly 7000, ECG without STEMI but has sinus tachycardia. Echo today notable for low normal EF without focal wall motion abnormalities, severe AS, and right atrial pressure of 3 mmHg.  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 He is being started on argatroban for this.  He is being diuresed due to ARDS. With preserved EF and noted normal right atrial pressures, no indication for swan/inotrope. No central access at this time to check CVP. Has not required pressor support. Blood pressure range from 94/44-174/87.  No acute indication for cath at this time. Recommend medical management for now. Would trial metoprolol tartrate 12.5 mg q6 hours to make sure he tolerates, can then uptitrate/consolidate. Already on aspirin, anticoagulation as above. Also on atorvastatin.   We will provide full consultation in the morning, but please call with any questions or concerns.  Buford Dresser, MD, PhD [facility]  [address], [place], [place] [address] [phone]

## 2019-11-24 NOTE — Progress Notes (Signed)
Pharmacy - Argatroban  Assessment:    Please see note from Leodis Sias, PharmD earlier today for full details.  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   2nd aPTT now therapeutic at 57 sec (goal 50-90 sec) on 0.6 mcg/kg/min  No bleeding or infusion issues per RN  Plan:   Continue argatroban at 0.6 mcg/kg/min  Recheck aPTT in 2 hr  APTT q2 hr until two therapeutic levels  Jeffrey Obrien, PharmD, BCPS [phone] 11/24/2019, 7:44 PM

## 2019-11-24 NOTE — Progress Notes (Signed)
MD Ramaswamy notified of sustained heartrate increase 125-130. One time verbal order for 3mg  IV lopressor given. Will continue to monitor.

## 2019-11-24 NOTE — Progress Notes (Signed)
Patient returned to [facility] per increased HR, increased WOB, and desat to 88% from 97%. Will attempt at later time if tolerated by patient.

## 2019-11-24 NOTE — Progress Notes (Signed)
Pharmacy - Argatroban  Assessment:    Please see note from Leodis Sias, PharmD earlier today for full details.  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   3rd aPTT remains therapeutic at 60 sec (goal 50-90 sec) on 0.6 mcg/kg/min (2nd consecutive therapeutic aPTT)  No bleeding or infusion issues per RN  Plan:   Continue argatroban at 0.6 mcg/kg/min  Recheck aPTT in 4 hr  APTT q2 hr until two therapeutic levels  Reuel Boom, PharmD, BCPS [phone] 11/24/2019, 9:26 PM

## 2019-11-24 NOTE — Progress Notes (Signed)
Bilateral lower extremity venous duplex has been completed. Preliminary results can be found in CV Proc through chart review.  Results were given to the patient's nurse, Hildred Alamin.  11/24/19 10:21 AM Carlos Levering RVT

## 2019-11-24 NOTE — Progress Notes (Signed)
Pharmacy Antibiotic Note  Jeffrey Obrien is a 79 y.o. male presented to [facility] on 3/15 with SOB. At [facility], he was started in azithromycin and ceftriaxone and then changed to zosyn. He transferred to [facility] on 11/08/2019  for management of respiratory failure.  Pharmacy was consulted to start vancomycin and cefepime for PNA. Vanc was stopped 3/22 with negative MRSA PCR  11/24/2019  ABX D#5 Scr down to 1.46, CrCl ~ 40 mls/min  AF/hypothermic; WBC coming down. Intubated and sedated on propofol and dilaudid. Failed SBT.   Plan: increase cefepime to 2gm IV q12h - follow renal function and clinical course - f/u PCT  ____________________________________  Height: _0  (170.2 cm) Weight: 156 lb 8.4 oz (71 kg) IBW/kg (Calculated) : 66.1  Temp (24hrs), Avg:98.3 F (36.8 C), Min:97.5 F (36.4 C), Max:99.8 F (37.7 C)  Recent Labs  Lab 11/08/2019 1416 11/21/19 0430 11/21/19 1834 11/23/19 0355 11/24/19 0412  WBC 31.4* 23.9*  --  12.0*  --   CREATININE 1.33* 2.46* 3.37* 2.01* 1.46*    Estimated Creatinine Clearance: 39.6 mL/min (A) (by C-G formula based on SCr of 1.46 mg/dL (H)).    No Known Allergies  Antimicrobials this admission:  3/15 azithro>>3/20 3/15 CTX>>3/18 3/18 zosyn>> 3/20 3/20 cefepime>> 3/20 vanc>>3/22 Dose adjustments this admission:  3/24 cefepime 2 gm q24> q12 Microbiology results:  3/16 BCx x2 (at [place]): ngtd 3/18 Sputum (at [place]): normal resp flora  3/20 MRSA PCR: neg 3/20 BCx: ngtd 3/20 BAL: normal resp flora F 3/20 AFBx2: ip 3/20 Fungus: ip 3/21 Resp panel: rhinovirus/enterovirus  Thank you for allowing pharmacy to be a part of this patient's care  Eudelia Bunch, Pharm.D [phone] 11/24/2019 10:01 AM

## 2019-11-24 NOTE — Progress Notes (Addendum)
Pharmacy Heparin Induced Thrombocytopenia (HIT) Note:  Jeffrey Obrien is an 79 y.o. male being evaluated for HIT. LMWH  was started 11/27/2019 for VTE px and baseline platelets were 382 on 11/15/2019.  HIT labs were ordered on 11/24/19 when platelets dropped to 142.  Auto-populate labs: No results found for: HEPINDPLTAB, SRALOWDOSEHP, SRAHIGHDOSEH   CALCULATE SCORE:  4Ts (see the HIT Algorithm) Score  Thrombocytopenia 2  Timing 0  Thrombosis 2  Other causes of thrombocytopenia 1  Total 5   First dose VTE px LMWH  3/20 @ 1844. Last dose LMWH 30 3/24 at 0942 am.  New R DVT.  CCM has ordered direct thrombin inhibitor per pharmacy. Will initiate argatroban per protocol.    Plan Labs ordered:  SRA ordered  Heparin allergy:  Heparin allergy documented or updated. Anticoagulation plans:  Begin alternative anticoagulation with argatroban  Start argatroban at 0.5 mcg/kg/min and check q2 APTTs with goal 50-90 seconds. Baseline INR ordered. Daily CBC & aPTT  Eudelia Bunch, Pharm.D [phone] 11/24/2019 11:00 AM

## 2019-11-25 ENCOUNTER — Encounter (HOSPITAL_COMMUNITY): Payer: Self-pay | Admitting: Pulmonary Disease

## 2019-11-25 ENCOUNTER — Inpatient Hospital Stay (HOSPITAL_COMMUNITY): Payer: Medicare HMO

## 2019-11-25 DIAGNOSIS — R000 Tachycardia, unspecified: Secondary | ICD-10-CM

## 2019-11-25 DIAGNOSIS — I248 Other forms of acute ischemic heart disease: Secondary | ICD-10-CM

## 2019-11-25 LAB — CK TOTAL AND CKMB (NOT AT ARMC)
CK, MB: 2.9 ng/mL (ref 0.5–5.0)
Relative Index: 2.9 — ABNORMAL HIGH (ref 0.0–2.5)
Total CK: 101 U/L (ref 49–397)

## 2019-11-25 LAB — COMPREHENSIVE METABOLIC PANEL
ALT: 22 U/L (ref 0–44)
AST: 23 U/L (ref 15–41)
Albumin: 2.1 g/dL — ABNORMAL LOW (ref 3.5–5.0)
Alkaline Phosphatase: 61 U/L (ref 38–126)
Anion gap: 7 (ref 5–15)
BUN: 57 mg/dL — ABNORMAL HIGH (ref 8–23)
CO2: 31 mmol/L (ref 22–32)
Calcium: 8.1 mg/dL — ABNORMAL LOW (ref 8.9–10.3)
Chloride: 108 mmol/L (ref 98–111)
Creatinine, Ser: 1.56 mg/dL — ABNORMAL HIGH (ref 0.61–1.24)
GFR calc Af Amer: 49 mL/min — ABNORMAL LOW (ref >60)
GFR calc non Af Amer: 42 mL/min — ABNORMAL LOW (ref >60)
Glucose, Bld: 158 mg/dL — ABNORMAL HIGH (ref 70–99)
Potassium: 4.2 mmol/L (ref 3.5–5.1)
Sodium: 146 mmol/L — ABNORMAL HIGH (ref 135–145)
Total Bilirubin: 0.7 mg/dL (ref 0.3–1.2)
Total Protein: 5.3 g/dL — ABNORMAL LOW (ref 6.5–8.1)

## 2019-11-25 LAB — CBC
HCT: 35.9 % — ABNORMAL LOW (ref 39.0–52.0)
Hemoglobin: 11 g/dL — ABNORMAL LOW (ref 13.0–17.0)
MCH: 28.6 pg (ref 26.0–34.0)
MCHC: 30.6 g/dL (ref 30.0–36.0)
MCV: 93.2 fL (ref 80.0–100.0)
Platelets: 168 10*3/uL (ref 150–400)
RBC: 3.85 MIL/uL — ABNORMAL LOW (ref 4.22–5.81)
RDW: 13.2 % (ref 11.5–15.5)
WBC: 17.3 10*3/uL — ABNORMAL HIGH (ref 4.0–10.5)
nRBC: 0 % (ref 0.0–0.2)

## 2019-11-25 LAB — CULTURE, BLOOD (ROUTINE X 2)
Culture: NO GROWTH
Culture: NO GROWTH
Special Requests: ADEQUATE
Special Requests: ADEQUATE

## 2019-11-25 LAB — LACTIC ACID, PLASMA: Lactic Acid, Venous: 0.9 mmol/L (ref 0.5–1.9)

## 2019-11-25 LAB — GLUCOSE, CAPILLARY
Glucose-Capillary: 132 mg/dL — ABNORMAL HIGH (ref 70–99)
Glucose-Capillary: 159 mg/dL — ABNORMAL HIGH (ref 70–99)
Glucose-Capillary: 169 mg/dL — ABNORMAL HIGH (ref 70–99)
Glucose-Capillary: 191 mg/dL — ABNORMAL HIGH (ref 70–99)
Glucose-Capillary: 221 mg/dL — ABNORMAL HIGH (ref 70–99)
Glucose-Capillary: 239 mg/dL — ABNORMAL HIGH (ref 70–99)

## 2019-11-25 LAB — TROPONIN I (HIGH SENSITIVITY): Troponin I (High Sensitivity): 5180 ng/L (ref <18)

## 2019-11-25 LAB — PROCALCITONIN: Procalcitonin: 0.18 ng/mL

## 2019-11-25 LAB — TRIGLYCERIDES: Triglycerides: 80 mg/dL (ref <150)

## 2019-11-25 LAB — HEPARIN INDUCED PLATELET AB (HIT ANTIBODY): Heparin Induced Plt Ab: 0.237 OD (ref 0.000–0.400)

## 2019-11-25 LAB — APTT: aPTT: 61 seconds — ABNORMAL HIGH (ref 24–36)

## 2019-11-25 MED ORDER — FUROSEMIDE 10 MG/ML IJ SOLN
40.0000 mg | Freq: Once | INTRAMUSCULAR | Status: AC
  Administered 2019-11-25: 40 mg via INTRAVENOUS
  Filled 2019-11-25: qty 4

## 2019-11-25 MED ORDER — POTASSIUM CHLORIDE 20 MEQ/15ML (10%) PO SOLN
20.0000 meq | Freq: Once | ORAL | Status: AC
  Administered 2019-11-25: 20 meq
  Filled 2019-11-25: qty 15

## 2019-11-25 NOTE — Progress Notes (Signed)
PT SATS WERE LOW AND RT INCREASED THE PT'S OXYGEN 55% AND THE DOCTOR INCREASED THE PEEP TO 8. tHE PT'S SATS ARE 88-90%

## 2019-11-25 NOTE — Progress Notes (Signed)
Pharmacy - Argatroban  Assessment:    Please see note from Leodis Sias, PharmD earlier today for full details.  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   3rd aPTT remains therapeutic at 61 sec (goal 50-90 sec) on 0.6 mcg/kg/min (3rd consecutive therapeutic aPTT)  No bleeding or infusion issues per RN  Plan:   Continue argatroban at 0.6 mcg/kg/min  Recheck aPTT at least daily  APTT q2 hr until two therapeutic levels

## 2019-11-25 NOTE — Progress Notes (Signed)
CRITICAL VALUE ALERT  Critical Value:  Troponin I (High Sensitivity): 5180 Date & Time Notied:  11/25/2018 [id number]  Provider Notified: Warren Blayne MD  Orders Received/Actions taken: Continue to monitor

## 2019-11-25 NOTE — Consult Note (Addendum)
Cardiology Consultation:   Patient ID: Jeffrey Obrien MRN: [id number]; DOB: August 17, 1941  Admit date: 11/19/2019 Date of Consult: 11/25/2019  Primary Care Provider: Angelina Sheriff, MD Primary Cardiologist: Dr. Otho Perl @ [phone] Primary Electrophysiologist:  None   Patient Profile:   Jeffrey Obrien is a 79 y.o. male with a hx of Severe aortic stenosis, aortic insufficiency, essential HTN, bacteremia in 2020, dyslipidemia, DM who is being seen today for the evaluation of elevated troponin and severe aortic stenosis at the request of Dr. Chase Caller.  History of Present Illness:   Mr. Voth has been followed by [facility] for severe aortic stenosis by echo 07/2018 and aortic regurgitation as well. Given lack of symptoms this had been followed clinically for now. He does have a history of strep mitis/oralis bacteremia and pneumonia in 03/2019 with TEE reported to be negative for endocarditis. During that admission he had acute kidney failure and very mild elevated troponin felt due to acute medical illness.  This admission, he presented initially to [facility] 3/15 with cough, SOB, hypoxemia and severe acute respiratory failure with rust colored sputum. He was treated with diuretics and antibiotics. Covid testing x2 was negative. He had received his Covid vaccine 3 weeks prior to admission. CTA at OSH was reported to show no PE, marked severity of diffuse bilateral partially GG appearing infiltrates, small bilateral R>L effusions. He was transferred to [facility] after failure to improve. He required intubation the day he arrived here 11/21/2019. He underwent bronchoscopy with BAL. Covid testing here was negative but positive for human rhinovirus/enterovirus with suspicion for bacterial superinfection with sepsis. Admission also notable for AKI/oliguria and anemia of critical illness. Lower extremity dopplers were obtained yesterday showing acute DVT involving the right peroneal and right  gastrocnemius veins. He had been getting Lovenox DVT ppx. Due to mildly decreasing platelet count of 142, Lovenox was discontinued due to concern for HITT and argatroban was started. BP has been soft earlier this admission but did not require pressors. On 11/24/19 his CXR was felt to be consistent with ALI +/- volume overload, prompting draw of troponins and echo. 2D echo showed EF 50-55% with septal HK, mildly dilated LV, moderate LVH of the basal septal segment, normal RV, severe calcification of AV with possible severe AS (suspected gradients were underestimated due to poor doppler signals, cannot r/o SBE based on exuberant calcification), trivial AI. hsTroponins returned [id number]. Most recent WBC 17k (peak 31k), Cr 1.56 (peak 3.37), albumin 2.1, procalcitonin 0.81 although did mount a fever last night to 100.7, plt normalized to 168 and Hgb 11.0. He remains intubated, sedated due to frequent agitation, with persistent sinus tachycardia on telemetry. Last CVP in chart was 3.   Past Medical History:  Diagnosis Date  . Aortic insufficiency   . Diabetes mellitus ([place])   . Dyslipidemia   . Essential hypertension   . 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 bacteremia 03/2019 with strep mitis.  . Severe aortic stenosis     History reviewed. No pertinent surgical history.   Home Medications:  Prior to Admission medications   Medication Sig Start Date End Date Taking? Authorizing Provider  amLODipine (NORVASC) 10 MG tablet Take 10 mg by mouth daily. 11/10/19  Yes [provider]  aspirin 81 MG EC tablet Take 81 mg by mouth daily. 07/23/19  Yes [provider]  atorvastatin (LIPITOR) 20 MG tablet Take 20 mg by mouth daily. 08/20/19  Yes [provider]  benzonatate (  TESSALON) 200 MG capsule Take 200 mg by mouth 3 (three) times daily as needed for cough.  11/12/19  Yes [provider]  cloNIDine (CATAPRES) 0.2 MG tablet Take 0.2 mg by mouth 2 (two) times  daily. 07/10/16  Yes [provider]  lisinopril (ZESTRIL) 20 MG tablet Take 20 mg by mouth daily. 09/28/19  Yes [provider]  metFORMIN (GLUCOPHAGE-XR) 500 MG 24 hr tablet Take 500 mg by mouth at bedtime. 03/18/19  Yes [provider]  metoprolol tartrate (LOPRESSOR) 25 MG tablet Take 25 mg by mouth 2 (two) times daily. 09/23/19  Yes [provider]  potassium chloride SA (KLOR-CON M20) 20 MEQ tablet Take 20 mEq by mouth 2 (two) times daily. 03/13/17  Yes [provider]  spironolactone (ALDACTONE) 25 MG tablet Take 25 mg by mouth daily. 09/03/16  Yes [provider]    Inpatient Medications: Scheduled Meds: . aspirin  81 mg Per Tube Daily  . atorvastatin  20 mg Per Tube q1800  . chlorhexidine gluconate (MEDLINE KIT)  15 mL Mouth Rinse BID  . Chlorhexidine Gluconate Cloth  6 each Topical Daily  . feeding supplement (PRO-STAT SUGAR FREE 64)  30 mL Per Tube TID  . insulin aspart  2 Units Subcutaneous Q4H  . insulin aspart  3-9 Units Subcutaneous Q4H  . mouth rinse  15 mL Mouth Rinse 10 times per day  . metoprolol tartrate  12.5 mg Per Tube BID  . pantoprazole sodium  40 mg Per Tube Q24H  . sodium chloride flush  10-40 mL Intracatheter Q12H  . sodium chloride flush  3 mL Intravenous Q12H   Continuous Infusions: . sodium chloride 10 mL/hr at 11/25/19 0800  . argatroban 0.6 mcg/kg/min (11/25/19 0800)  . ceFEPime (MAXIPIME) IV Stopped (11/25/19 6761)  . feeding supplement (VITAL AF 1.2 CAL) 45 mL/hr at 11/24/19 1720  . HYDROmorphone 0.5 mg/hr (11/25/19 0800)  . lactated ringers 10 mL/hr at 11/25/19 0800  . propofol (DIPRIVAN) infusion 10 mcg/kg/min (11/25/19 0800)   PRN Meds: sodium chloride, acetaminophen, HYDROmorphone, midazolam, sodium chloride flush, sodium chloride flush  Allergies:    Allergies  Allergen Reactions  . Heparin     HITT ab pending 3/24    Social History:   Social History   Socioeconomic History  . Marital  status: Married    Spouse name: Not on file  . Number of children: Not on file  . Years of education: Not on file  . Highest education level: Not on file  Occupational History  . Not on file  Tobacco Use  . Smoking status: Never Smoker  . Smokeless tobacco: Never Used  Substance and Sexual Activity  . Alcohol use: Not on file  . Drug use: Not on file  . Sexual activity: Not on file  Other Topics Concern  . Not on file  Social History Narrative  . Not on file   Social Determinants of Health   Financial Resource Strain:   . Difficulty of Paying Living Expenses:   Food Insecurity:   . Worried About Charity fundraiser in the Last Year:   . Arboriculturist in the Last Year:   Transportation Needs:   . Film/video editor (Medical):   Marland Kitchen Lack of Transportation (Non-Medical):   Physical Activity:   . Days of Exercise per Week:   . Minutes of Exercise per Session:   Stress:   . Feeling of Stress :   Social Connections:   .  Frequency of Communication with Friends and Family:   . Frequency of Social Gatherings with Friends and Family:   . Attends Religious Services:   . Active Member of Clubs or Organizations:   . Attends Archivist Meetings:   Marland Kitchen Marital Status:   Intimate Partner Violence:   . Fear of Current or Ex-Partner:   . Emotionally Abused:   Marland Kitchen Physically Abused:   . Sexually Abused:     Family History:   Unable to obtain, patient intubated.   ROS:  Unable to obtain, patient intubated.    Physical Exam/Data:   Vitals:   11/25/19 0500 11/25/19 0600 11/25/19 0700 11/25/19 0800  BP: (!) 112/54 (!) 111/56 123/63 138/66  Pulse: (!) 111 (!) 113 (!) 113 (!) 112  Resp: 15 14 (!) 24 18  Temp:      TempSrc:      SpO2: 99% 98% 99% 100%  Weight: 73.9 kg     Height:        Intake/Output Summary (Last 24 hours) at 11/25/2019 0816 Last data filed at 11/25/2019 0800 Gross per 24 hour  Intake 3383.44 ml  Output 3550 ml  Net -166.56 ml   Last 3  Weights 11/25/2019 11/24/2019 11/23/2019  Weight (lbs) 162 lb 14.7 oz 156 lb 8.4 oz 156 lb 8.4 oz  Weight (kg) 73.9 kg 71 kg 71 kg     Body mass index is 25.52 kg/m.  General: Ill appearing intubated WM in no acute distress Head: Normocephalic, atraumatic, sclera non-icteric, no xanthomas, nares are without discharge. Neck: JVP not elevated. Lungs: Diffuse rhonchi/coarse BS throughout without wheezing. On vent Heart: RRR S1 S2 without murmurs, rubs, or gallops.  Abdomen: Soft, non-tender, non-distended with normoactive bowel sounds. No rebound/guarding. Extremities: No clubbing or cyanosis. No edema. Distal pedal pulses are 2+ and equal bilaterally. Neuro: Sedated but occasional spontaneous movements Psych:  Sedated  EKG:  The EKG was personally reviewed and demonstrates:   11/17/2019: sinus tach 128bpm, possible LAE, left axis deviation, LVH with repolarization abnormality, prominent T waves in V3, TWI in I, avL  11/24/19: sinus tach 119bpm, similar morphology as above except less prominent TW in V3 and subtle TWI V5-V6 (located in shadow chart)  Telemetry:  Telemetry was personally reviewed and demonstrates:  Sinus tachycardia  Relevant CV Studies: 2D Echo 11/24/19 1. Septal hypokinesis . Left ventricular ejection fraction, by  estimation, is 50 to 55%. The left ventricle has low normal function. The  left ventricle has no regional wall motion abnormalities. The left  ventricular internal cavity size was mildly  dilated. There is moderate left ventricular hypertrophy of the  basal-septal segment. Left ventricular diastolic parameters are  indeterminate.  2. Right ventricular systolic function is normal. The right ventricular  size is normal.  3. The mitral valve is normal in structure. No evidence of mitral valve  regurgitation. No evidence of mitral stenosis.  4. Aortic valve appears tri cuspid with severe calcification and  restricted motion Morphologically should have severe AS  And suspect  gradients are under estimated due to poor doppler signals. Also cannot r/o  SBE given exuberant calcification Suggest  further evaluation with TEE if clincially indicated . The aortic valve is  tricuspid. Aortic valve regurgitation is trivial. Stenotic.  5. The inferior vena cava is normal in size with greater than 50%  respiratory variability, suggesting right atrial pressure of 3 mmHg.   Laboratory Data:  High Sensitivity Troponin:   Recent Labs  Lab 11/24/19 1014 11/25/19  [place]* 5,180*     Chemistry Recent Labs  Lab 11/23/19 0355 11/24/19 0412 11/25/19 0436  NA 142 144 146*  K 3.9 3.9 4.2  CL 108 111 108  CO2 _0 GLUCOSE 181* 150* 158*  BUN 70* 59* 57*  CREATININE 2.01* 1.46* 1.56*  CALCIUM 7.8* 7.9* 8.1*  GFRNONAA 31* 46* 42*  GFRAA 36* 53* 49*  ANIONGAP _1 Recent Labs  Lab 11/27/2019 1416 11/23/19 0355 11/25/19 0436  PROT 7.2 5.3* 5.3*  ALBUMIN 3.2* 2.2* 2.1*  AST 35 32 23  ALT 51* 32 22  ALKPHOS 171* 71 61  BILITOT 1.6* 0.6 0.7   Hematology Recent Labs  Lab 11/21/19 0430 11/23/19 0355 11/25/19 0436  WBC 23.9* 12.0* 17.3*  RBC 3.88* 3.52* 3.85*  HGB 11.4* 10.2* 11.0*  HCT 35.3* 32.2* 35.9*  MCV 91.0 91.5 93.2  MCH 29.4 29.0 28.6  MCHC 32.3 31.7 30.6  RDW 13.2 13.1 13.2  PLT 228 142* 168   BNP Recent Labs  Lab 11/11/2019 1416 11/24/19 1014  BNP 525.5* 1,034.6*    DDimer No results for input(s): DDIMER in the last 168 hours.   Radiology/Studies:  DG Abd 1 View  Result Date: 11/23/2019 CLINICAL DATA:  Feeding tube repositioning EXAM: ABDOMEN - 1 VIEW COMPARISON:  11/21/2019 FINDINGS: Frontal view of the lower chest and upper abdomen demonstrates enteric catheter projecting over the expected course of the proximal duodenum, tip overlying second portion duodenum. Bowel gas pattern is unremarkable. Persistent interstitial and ground-glass opacities at the lung bases, though improved since prior study.  IMPRESSION: 1. Enteric catheter tip projecting over second portion duodenum. 2. Persistent but improving bibasilar interstitial and ground-glass opacities. Electronically Signed   By: Randa Ngo M.D.   On: 11/23/2019 08:31   DG Abd 1 View  Result Date: 11/21/2019 CLINICAL DATA:  Orogastric tube placement. EXAM: ABDOMEN - 1 VIEW COMPARISON:  None. FINDINGS: A nasogastric tube is seen with its distal tip overlying the medial aspect of the mid to upper right abdomen. This is likely within the region of the gastric antrum versus proximal duodenum. The bowel gas pattern is normal. No radio-opaque calculi or other significant radiographic abnormality are seen. IMPRESSION: Nasogastric tube positioning, as described above. Electronically Signed   By: Virgina Norfolk M.D.   On: 11/21/2019 19:22   DG CHEST PORT 1 VIEW  Result Date: 11/25/2019 CLINICAL DATA:  Intubation.  ARDS. EXAM: PORTABLE CHEST 1 VIEW COMPARISON:  11/24/2019.  11/23/2019.  CT 11/15/2019. FINDINGS: Endotracheal tube, left IJ line, NG tube in stable position. Heart size normal. Diffuse severe bilateral pulmonary infiltrates are again noted. Progressive right lung atelectatic changes/infiltrates noted. Bibasilar atelectasis. No pleural effusion or pneumothorax. IMPRESSION: 1.  Lines and tubes in stable position. 2. Diffuse severe bilateral pulmonary infiltrates are again noted. Progressive right lung atelectatic changes/infiltrates noted. Bibasilar atelectasis. Electronically Signed   By: Marcello Moores  Register   On: 11/25/2019 07:07   DG Chest Port 1 View  Result Date: 11/24/2019 CLINICAL DATA:  Respiratory failure EXAM: PORTABLE CHEST 1 VIEW COMPARISON:  November 23, 2019 FINDINGS: The heart size and mediastinal contours are unchanged. ETT is 2.7 cm above the level of the carina. NG tube is seen below the diaphragm. A left-sided central venous catheter seen at the brachiocephalic SVC junction. Again noted are multifocal patchy hazy airspace  opacities throughout both lungs without significant change. No acute osseous abnormality. IMPRESSION: No significant interval change in the multifocal  airspace patchy opacities. Electronically Signed   By: Prudencio Pair M.D.   On: 11/24/2019 05:58   DG Chest Port 1 View  Result Date: 11/23/2019 CLINICAL DATA:  Respiratory failure EXAM: PORTABLE CHEST 1 VIEW COMPARISON:  Yesterday FINDINGS: The endotracheal tube tip is just below the clavicular heads. Left IJ line with tip at the upper SVC. The enteric tube at least reaches the stomach. Extensive bilateral airspace disease without definite progression. No visible effusion or air leak. Normal heart size when accounting for rotation IMPRESSION: Stable hardware positioning and bilateral airspace disease. Electronically Signed   By: Monte Fantasia M.D.   On: 11/23/2019 05:32   DG Chest Port 1 View  Result Date: 11/22/2019 CLINICAL DATA:  Respiratory failure EXAM: PORTABLE CHEST 1 VIEW COMPARISON:  Yesterday FINDINGS: Endotracheal tube tip is just below the clavicular heads. Left IJ line with tip at the upper SVC. The enteric tube at least reaches the stomach. Symmetric interstitial and airspace disease with improvement from yesterday. Normal heart size. No visible effusion or pneumothorax IMPRESSION: 1. Unremarkable hardware positioning. 2. Symmetric opacity with significant improvement since 2 days ago, likely improved edema. Electronically Signed   By: Monte Fantasia M.D.   On: 11/22/2019 06:43   DG Abd Portable 1V  Result Date: 11/24/2019 CLINICAL DATA:  Orogastric tube placement EXAM: PORTABLE ABDOMEN - 1 VIEW COMPARISON:  November 23, 2019 FINDINGS: Orogastric tube tip is at the duodenal-jejunal junction. No bowel dilatation or air-fluid level noted to suggest bowel obstruction. No free air. Patchy airspace opacity in both lung bases noted. IMPRESSION: Orogastric tube tip at the duodenal-jejunal junction. No bowel obstruction or free air. Infiltrate in  both lung bases. Electronically Signed   By: Lowella Grip III M.D.   On: 11/24/2019 11:02   ECHOCARDIOGRAM COMPLETE  Result Date: 11/24/2019    ECHOCARDIOGRAM REPORT   Patient Name:   Zackery Barefoot Date of Exam: 11/24/2019 Medical Rec #:  [id number]  Height:       67.0 in Accession #:    [id number] Weight:       156.5 lb Date of Birth:  02-25-1941   BSA:          1.822 m Patient Age:    47 years   BP:           128/61 mmHg Patient Gender: M          HR:           92 bpm. Exam Location:  Inpatient Procedure: 2D Echo STAT ECHO Indications:    424.1 aortic stenosis  History:        Patient has no prior history of Echocardiogram examinations. No                 hx on file (prior AS hx per echo not on file).  Sonographer:    Jannett Celestine RDCS (AE) Referring Phys: [place]  1. Septal hypokinesis . Left ventricular ejection fraction, by estimation, is 50 to 55%. The left ventricle has low normal function. The left ventricle has no regional wall motion abnormalities. The left ventricular internal cavity size was mildly dilated. There is moderate left ventricular hypertrophy of the basal-septal segment. Left ventricular diastolic parameters are indeterminate.  2. Right ventricular systolic function is normal. The right ventricular size is normal.  3. The mitral valve is normal in structure. No evidence of mitral valve regurgitation. No evidence of mitral stenosis.  4. Aortic valve appears tri cuspid with severe  calcification and restricted motion Morphologically should have severe AS And suspect gradients are under estimated due to poor doppler signals. Also cannot r/o SBE given exuberant calcification Suggest further evaluation with TEE if clincially indicated . The aortic valve is tricuspid. Aortic valve regurgitation is trivial. Stenotic.  5. The inferior vena cava is normal in size with greater than 50% respiratory variability, suggesting right atrial pressure of 3 mmHg. FINDINGS  Left  Ventricle: Septal hypokinesis. Left ventricular ejection fraction, by estimation, is 50 to 55%. The left ventricle has low normal function. The left ventricle has no regional wall motion abnormalities. The left ventricular internal cavity size was mildly dilated. There is moderate left ventricular hypertrophy of the basal-septal segment. Left ventricular diastolic parameters are indeterminate. Right Ventricle: The right ventricular size is normal. No increase in right ventricular wall thickness. Right ventricular systolic function is normal. Left Atrium: Left atrial size was normal in size. Right Atrium: Right atrial size was normal in size. Pericardium: There is no evidence of pericardial effusion. Mitral Valve: The mitral valve is normal in structure. There is moderate thickening of the mitral valve leaflet(s). There is moderate calcification of the mitral valve leaflet(s). Normal mobility of the mitral valve leaflets. Moderate mitral annular calcification. No evidence of mitral valve regurgitation. No evidence of mitral valve stenosis. Tricuspid Valve: The tricuspid valve is normal in structure. Tricuspid valve regurgitation is mild . No evidence of tricuspid stenosis. Aortic Valve: Aortic valve appears tri cuspid with severe calcification and restricted motion Morphologically should have severe AS And suspect gradients are under estimated due to poor doppler signals. Also cannot r/o SBE given exuberant calcification Suggest further evaluation with TEE if clincially indicated. The aortic valve is tricuspid. . There is severe thickening and severe calcifcation of the aortic valve. Aortic valve regurgitation is trivial. Stenotic. Severe aortic valve annular calcification. There is severe thickening of the aortic valve. There is severe calcifcation of the aortic valve. Aortic valve mean gradient measures 15.2 mmHg. Aortic valve peak gradient measures 24.0 mmHg. Aortic valve area, by VTI measures 0.93 cm. Pulmonic  Valve: The pulmonic valve was normal in structure. Pulmonic valve regurgitation is trivial. No evidence of pulmonic stenosis. Aorta: The aortic root is normal in size and structure. Venous: The inferior vena cava is normal in size with greater than 50% respiratory variability, suggesting right atrial pressure of 3 mmHg. IAS/Shunts: The interatrial septum was not well visualized.  LEFT VENTRICLE PLAX 2D LVIDd:         3.70 cm LVIDs:         2.70 cm LV PW:         1.10 cm LV IVS:        1.30 cm LVOT diam:     1.90 cm LV SV:         44 LV SV Index:   24 LVOT Area:     2.84 cm  RIGHT VENTRICLE RV S prime:     13.50 cm/s TAPSE (M-mode): 1.8 cm LEFT ATRIUM           Index       RIGHT ATRIUM           Index LA diam:      3.90 cm 2.14 cm/m  RA Area:     18.00 cm LA Vol (A4C): 43.8 ml 24.04 ml/m RA Volume:   40.70 ml  22.34 ml/m  AORTIC VALVE AV Area (Vmax):    0.95 cm AV Area (Vmean):   1.00 cm  AV Area (VTI):     0.93 cm AV Vmax:           245.00 cm/s AV Vmean:          177.925 cm/s AV VTI:            0.470 m AV Peak Grad:      24.0 mmHg AV Mean Grad:      15.2 mmHg LVOT Vmax:         82.50 cm/s LVOT Vmean:        62.500 cm/s LVOT VTI:          0.155 m LVOT/AV VTI ratio: 0.33  AORTA Ao Root diam: 2.90 cm  SHUNTS Systemic VTI:  0.16 m Systemic Diam: 1.90 cm Jenkins Rouge MD Electronically signed by Jenkins Rouge MD Signature Date/Time: 11/24/2019/12:57:24 PM    Final    VAS [place] LOWER EXTREMITY VENOUS (DVT)  Result Date: 11/24/2019  Lower Venous DVTStudy Indications: Thrombocytopenia.  Risk Factors: None identified. Comparison Study: No prior studies. Performing Technologist: Oliver Hum RVT  Examination Guidelines: A complete evaluation includes B-mode imaging, spectral Doppler, color Doppler, and power Doppler as needed of all accessible portions of each vessel. Bilateral testing is considered an integral part of a complete examination. Limited examinations for reoccurring indications may be performed as noted.  The reflux portion of the exam is performed with the patient in reverse Trendelenburg.  +---------+---------------+---------+-----------+----------+--------------+ RIGHT    CompressibilityPhasicitySpontaneityPropertiesThrombus Aging +---------+---------------+---------+-----------+----------+--------------+ CFV      Full           Yes      Yes                                 +---------+---------------+---------+-----------+----------+--------------+ SFJ      Full                                                        +---------+---------------+---------+-----------+----------+--------------+ FV Prox  Full                                                        +---------+---------------+---------+-----------+----------+--------------+ FV Mid   Full                                                        +---------+---------------+---------+-----------+----------+--------------+ FV DistalFull                                                        +---------+---------------+---------+-----------+----------+--------------+ PFV      Full                                                        +---------+---------------+---------+-----------+----------+--------------+  POP      Full           Yes      Yes                                 +---------+---------------+---------+-----------+----------+--------------+ PTV      Full                                                        +---------+---------------+---------+-----------+----------+--------------+ PERO     Partial                                      Acute          +---------+---------------+---------+-----------+----------+--------------+ Gastroc  None                                         Acute          +---------+---------------+---------+-----------+----------+--------------+   +---------+---------------+---------+-----------+----------+--------------+ LEFT      CompressibilityPhasicitySpontaneityPropertiesThrombus Aging +---------+---------------+---------+-----------+----------+--------------+ CFV      Full           Yes      Yes                                 +---------+---------------+---------+-----------+----------+--------------+ SFJ      Full                                                        +---------+---------------+---------+-----------+----------+--------------+ FV Prox  Full                                                        +---------+---------------+---------+-----------+----------+--------------+ FV Mid   Full                                                        +---------+---------------+---------+-----------+----------+--------------+ FV DistalFull                                                        +---------+---------------+---------+-----------+----------+--------------+ PFV      Full                                                        +---------+---------------+---------+-----------+----------+--------------+  POP      Full           Yes      Yes                                 +---------+---------------+---------+-----------+----------+--------------+ PTV      Full                                                        +---------+---------------+---------+-----------+----------+--------------+ PERO     Full                                                        +---------+---------------+---------+-----------+----------+--------------+     Summary: RIGHT: - Findings consistent with acute deep vein thrombosis involving the right peroneal veins, and right gastrocnemius veins. - No cystic structure found in the popliteal fossa.  LEFT: - There is no evidence of deep vein thrombosis in the lower extremity.  *See table(s) above for measurements and observations. Electronically signed by Ruta Hinds MD on 11/24/2019 at 4:06:31 PM.    Final      Assessment and Plan:    1. Acute respiratory failure and ARDS felt due to rhinovirus pneumonia (+human rhinovirus/enterovirus on labs) - PCCM managing. Still mounted temp overnight 100.7. Covid negative on several assays. Tachycardic (sinus).  2. Elevated troponin - suspect due to demand ischemia but cannot exclude underlying CAD. Septal hypokinesis noted on echo, With fairly preserved LV function on echo otherwise doubt this is primary issue but will need investigation contingent on recovery course. Need to keep in mind infection issues and acute DVT. He is now on ASA, statin, beta blocker, argatroban. Check lipids in AM and titrate statin with LDL information. May be able to titrate metoprolol if BP remains stable this morning.  3. Severe aortic stenosis with heavy calcification - suspect contributing to degree of illness he is sustaining with the elevated troponin. However, echo also raises question of SBE. Blood cultures negative so far. Of note, bacteremia 03/2019 with negative TEE at that time. Will ask MD to review echo and weigh in. Volume status looks OK.  4. Acute kidney injury - peak Cr 3.37, down to 1.46 yesterday and 1.56 today. Trending.  5. Mild anemia of critical illness with mild thrombocytopenia - HITT panel pending. Lovenox discontinued, TCP resolved, Hgb stable.  6. Essential HTN - BP is normal this morning.  7. Acute RLE DVT on duplex 11/24/19 - now on argatroban per pharmacy due to concern for HITT (was on Lovenox DVT ppx prior to diagnosis).  For questions or updates, please contact [place] Please consult [email or web address] for contact info under     Signed, Charlie Pitter, PA-C  11/25/2019 8:16 AM

## 2019-11-25 NOTE — Progress Notes (Signed)
NAME:  Jeffrey Obrien, MRN:  [id number], DOB:  07-29-1941, LOS: 5 ADMISSION DATE:  11/21/2019, CONSULTATION DATE:  3/20 REFERRING MD:  [facility], CHIEF COMPLAINT:  Dyspnea   Brief History   79 y/o male admitted on 3/20 from [facility] where he was admitted for pneumonia causing severe acute respiratory failure with hypoxemia on 3/15.    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   has no past surgical history on file.   Significant Hospital Events   3/15 admission [facility] 3/20 admission [facility], intubation, prone position 3/23- Heavily sedated. FiO2 needs down  Consults:  none  Procedures:  3/20 ETT >  3/20 L IJ CVL >   Significant Diagnostic Tests:  3/15 CT angiogram chest OSH> no PE, marked severity diffuse bilateral partially ground-glass appearing infiltrates. Small bilateral pleural effusions, R > L. Received his second COVID vaccine 3 weeks prior to admission.  3/16 TTE > Mild to moderate aortic regurgitation, severe aortic stenosis, peak gradient 17mHg, mild LVH, LVEF 55-60%, LA dilated  Micro Data:  3/15 SARS COV 2 > neg 3/18 SARS COV 2 > neg 3/15 blood culture > neg 3/20 sputum culture > GPC 3/20 urine strep >  3/20 urine legionella >  3/20 BAL >  3/20 BAL fungus >  3/20 BAL AFB >  3/20 blood >  3/21 RVP> rhinovirus  Antimicrobials:  3/20 vanc >  3/20 cefepime >   Interim history/subjective:   Air.  Traps and has abdominal chest wall paradoxus when sedation decreased  Objective   Blood pressure 135/72, pulse (!) 115, temperature 98.2 F (36.8 C), temperature source Axillary, resp. rate (!) 30, height _0  (1.702 m), weight 73.9 kg, SpO2 (!) 88 %. CVP:  [3 mmHg-10 mmHg] 10 mmHg  Vent Mode: PRVC FiO2 (%):  [40 %-55 %] 55 % Set  Rate:  [30 bmp] 30 bmp Vt Set:  [390 mL] 390 mL PEEP:  [5 cmH20-8 cmH20] 8 cmH20 Pressure Support:  [5 cmH20] 5 cmH20 Plateau Pressure:  [14 cmH20-23 cmH20] 14 cmH20   Intake/Output Summary (Last 24 hours) at 11/25/2019 1157 Last data filed at 11/25/2019 1000 Gross per 24 hour  Intake 3160.83 ml  Output 3550 ml  Net -389.17 ml   Filed Weights   11/23/19 0359 11/24/19 0412 11/25/19 0500  Weight: 71 kg 71 kg 73.9 kg   Vent Mode: PRVC FiO2 (%):  [40 %-55 %] 55 % Set Rate:  [30 bmp] 30 bmp Vt Set:  [390 mL] 390 mL PEEP:  [5 cmH20-8 cmH20] 8 cmH20 Pressure Support:  [5 cmH20] 5 cmH20 Plateau Pressure:  [14 cmH20-23 cmH20] 14 cmH20  Examination: General: Elderly male sedated on vent HEENT endotracheal tube is in place, gastric tube in place for tube feedings Neuro: Currently heavily sedated CV: Heart sounds regular with murmur PULM: Throughout Vent pressure regulated volume: FIO2 55  PEEP 5 RATE 30  VT 3 90 GI: soft, bsx4 active  GU: Extremities: warm/dry,  edema  Skin: no rashes or lesions      Resolved Hospital Problem list   Septic shock> resolved  Assessment & Plan:  Acute resp failure and ARDS from Rhinovirus pneumonia:     plan ARDS protocol Pneumomediastinum    Need for sedation: still very agitated, severe ventilator dyssynchrony  continue Dilaudid and propofol  AKI  11/25/2019 - improved  plan Continue to monitor  HCAP?   11/25/2019 - no major fever. Cutlure negative so far  plan Monitor culture data Antibiotics    Hx of Hypertension and Severe aortic stenosis Severe AS by 2D echo EF 55% Troponin 5180 Plan Cardiology has been consulted Argatroban in place  Anemia of critical illness  Recent Labs    11/23/19 0355 11/25/19 0436  HGB 10.2* 11.0*   Transfuse per protocol   Thromoboctyopenia - new on 3/24 142->168 Duplex study reveals acute deep vein thrombosis involving the right peroneal and gastrocnemius  veins Plan Currently on  Monitor platelets Platelets continue to decline will need IVC filter    DM2 CBG (last 3)  Recent Labs    11/24/19 2318 11/25/19 0322 11/25/19 0810  GLUCAP 213* 132* 159*    Continue sliding scale insulin protocol    Best practice:  Diet:  TF Pain/Anxiety/Delirium protocol (if indicated): diprivan and dilaudid VAP protocol (if indicated): nHOB ? 30 DVT prophylaxis: lovenox GI prophylaxis: protonix Glucose control: ssi Mobility: bed rest Code Status: full Family Communication: 11/25/2019 family updated at bedside  Disposition: remain in ICU       App cct 45 min  LABS    PULMONARY Recent Labs  Lab 11/28/2019 1547 11/01/2019 2200 11/21/19 1400 11/22/19 0300  PHART 7.179* 7.330* 7.273* 7.327*  PCO2ART 71.9* 46.4 52.8* 47.5  PO2ART 91.6 214* 120* 93.3  HCO3 25.7 23.9 23.6 24.2  O2SAT 92.9 99.1 98.7 97.1    CBC Recent Labs  Lab 11/21/19 0430 11/23/19 0355 11/25/19 0436  HGB 11.4* 10.2* 11.0*  HCT 35.3* 32.2* 35.9*  WBC 23.9* 12.0* 17.3*  PLT 228 142* 168    COAGULATION Recent Labs  Lab 11/24/19 1324  INR 1.1    CARDIAC  No results for input(s): TROPONINI in the last 168 hours. No results for input(s): PROBNP in the last 168 hours.   CHEMISTRY Recent Labs  Lab 11/17/2019 1416 11/23/2019 1416 11/21/19 0430 11/21/19 0430 11/21/19 1720 11/21/19 1834 11/21/19 1834 11/22/19 0300 11/23/19 0355 11/23/19 0355 11/24/19 0412 11/25/19 0436  NA 141   < > 141  --   --  142  --   --  142  --  144 146*  K 3.9   < > 4.8   < >  --  4.3   < >  --  3.9   < > 3.9 4.2  CL 101   < > 104  --   --  105  --   --  108  --  111 108  CO2 24   < > 26  --   --  24  --   --  26  --  26 31  GLUCOSE 357*   < > 256*  --   --  207*  --   --  181*  --  150* 158*  BUN 28*   < > 52*  --   --  78*  --   --  70*  --  59* 57*  CREATININE 1.33*   < > 2.46*  --   --  3.37*  --   --  2.01*  --  1.46* 1.56*  CALCIUM 8.7*   < > 8.2*  --   --  8.0*  --    --  7.8*  --  7.9* 8.1*  MG 2.2  --  2.6*  --  2.6*  --   --  2.7*  --   --  2.4  --   PHOS 3.9  --  7.4*  --  7.8*  --   --  5.9*  --   --  3.3  --    < > = values in this interval not displayed.   Estimated Creatinine Clearance: 37.1 mL/min (A) (by C-G formula based on SCr of 1.56 mg/dL (H)).   LIVER Recent Labs  Lab 11/17/2019 1416 11/23/19 0355 11/24/19 1324 11/25/19 0436  AST 35 32  --  23  ALT 51* 32  --  22  ALKPHOS 171* 71  --  61  BILITOT 1.6* 0.6  --  0.7  PROT 7.2 5.3*  --  5.3*  ALBUMIN 3.2* 2.2*  --  2.1*  INR  --   --  1.1  --      INFECTIOUS Recent Labs  Lab 11/24/19 1014 11/25/19 0436 11/25/19 0445  LATICACIDVEN  --   --  0.9  PROCALCITON 0.10 0.18  --      ENDOCRINE CBG (last 3)  Recent Labs    11/24/19 2318 11/25/19 0322 11/25/19 0810  GLUCAP 213* 132* 159*         IMAGING x48h  - image(s) personally visualized  -   highlighted in bold DG CHEST PORT 1 VIEW  Result Date: 11/25/2019 CLINICAL DATA:  Intubation.  ARDS. EXAM: PORTABLE CHEST 1 VIEW COMPARISON:  11/24/2019.  11/23/2019.  CT 11/15/2019. FINDINGS: Endotracheal tube, left IJ line, NG tube in stable position. Heart size normal. Diffuse severe bilateral pulmonary infiltrates are again noted. Progressive right lung atelectatic changes/infiltrates noted. Bibasilar atelectasis. No pleural effusion or pneumothorax. IMPRESSION: 1.  Lines and tubes in stable position. 2. Diffuse severe bilateral pulmonary infiltrates are again noted. Progressive right lung atelectatic changes/infiltrates noted. Bibasilar atelectasis. Electronically Signed   By: Marcello Moores  Register   On: 11/25/2019 07:07   DG Chest Port 1 View  Result Date: 11/24/2019 CLINICAL DATA:  Respiratory failure EXAM: PORTABLE CHEST 1 VIEW COMPARISON:  November 23, 2019 FINDINGS: The heart size and mediastinal contours are unchanged. ETT is 2.7 cm above the level of the carina. NG tube is seen below the diaphragm. A left-sided central venous  catheter seen at the brachiocephalic SVC junction. Again noted are multifocal patchy hazy airspace opacities throughout both lungs without significant change. No acute osseous abnormality. IMPRESSION: No significant interval change in the multifocal airspace patchy opacities. Electronically Signed   By: Prudencio Pair M.D.   On: 11/24/2019 05:58   DG Abd Portable 1V  Result Date: 11/24/2019 CLINICAL DATA:  Orogastric tube placement EXAM: PORTABLE ABDOMEN - 1 VIEW COMPARISON:  November 23, 2019 FINDINGS: Orogastric tube tip is at the duodenal-jejunal junction. No bowel dilatation or air-fluid level noted to suggest bowel obstruction. No free air. Patchy airspace opacity in both lung bases noted. IMPRESSION: Orogastric tube tip at the duodenal-jejunal junction. No bowel obstruction or free air. Infiltrate in both lung bases. Electronically Signed   By: Lowella Grip III M.D.   On: 11/24/2019 11:02   ECHOCARDIOGRAM COMPLETE  Result Date: 11/24/2019    ECHOCARDIOGRAM REPORT   Patient Name:   Jeffrey Obrien Date of Exam: 11/24/2019 Medical Rec #:  [id number]  Height:       67.0 in Accession #:    [id number] Weight:       156.5 lb Date of Birth:  05/23/1941   BSA:          1.822 m Patient Age:    36 years   BP:  128/61 mmHg Patient Gender: M          HR:           92 bpm. Exam Location:  Inpatient Procedure: 2D Echo STAT ECHO Indications:    424.1 aortic stenosis  History:        Patient has no prior history of Echocardiogram examinations. No                 hx on file (prior AS hx per echo not on file).  Sonographer:    Jannett Celestine RDCS (AE) Referring Phys: [place]  1. Septal hypokinesis . Left ventricular ejection fraction, by estimation, is 50 to 55%. The left ventricle has low normal function. The left ventricle has no regional wall motion abnormalities. The left ventricular internal cavity size was mildly dilated. There is moderate left ventricular hypertrophy of the basal-septal  segment. Left ventricular diastolic parameters are indeterminate.  2. Right ventricular systolic function is normal. The right ventricular size is normal.  3. The mitral valve is normal in structure. No evidence of mitral valve regurgitation. No evidence of mitral stenosis.  4. Aortic valve appears tri cuspid with severe calcification and restricted motion Morphologically should have severe AS And suspect gradients are under estimated due to poor doppler signals. Also cannot r/o SBE given exuberant calcification Suggest further evaluation with TEE if clincially indicated . The aortic valve is tricuspid. Aortic valve regurgitation is trivial. Stenotic.  5. The inferior vena cava is normal in size with greater than 50% respiratory variability, suggesting right atrial pressure of 3 mmHg. FINDINGS  Left Ventricle: Septal hypokinesis. Left ventricular ejection fraction, by estimation, is 50 to 55%. The left ventricle has low normal function. The left ventricle has no regional wall motion abnormalities. The left ventricular internal cavity size was mildly dilated. There is moderate left ventricular hypertrophy of the basal-septal segment. Left ventricular diastolic parameters are indeterminate. Right Ventricle: The right ventricular size is normal. No increase in right ventricular wall thickness. Right ventricular systolic function is normal. Left Atrium: Left atrial size was normal in size. Right Atrium: Right atrial size was normal in size. Pericardium: There is no evidence of pericardial effusion. Mitral Valve: The mitral valve is normal in structure. There is moderate thickening of the mitral valve leaflet(s). There is moderate calcification of the mitral valve leaflet(s). Normal mobility of the mitral valve leaflets. Moderate mitral annular calcification. No evidence of mitral valve regurgitation. No evidence of mitral valve stenosis. Tricuspid Valve: The tricuspid valve is normal in structure. Tricuspid valve  regurgitation is mild . No evidence of tricuspid stenosis. Aortic Valve: Aortic valve appears tri cuspid with severe calcification and restricted motion Morphologically should have severe AS And suspect gradients are under estimated due to poor doppler signals. Also cannot r/o SBE given exuberant calcification Suggest further evaluation with TEE if clincially indicated. The aortic valve is tricuspid. . There is severe thickening and severe calcifcation of the aortic valve. Aortic valve regurgitation is trivial. Stenotic. Severe aortic valve annular calcification. There is severe thickening of the aortic valve. There is severe calcifcation of the aortic valve. Aortic valve mean gradient measures 15.2 mmHg. Aortic valve peak gradient measures 24.0 mmHg. Aortic valve area, by VTI measures 0.93 cm. Pulmonic Valve: The pulmonic valve was normal in structure. Pulmonic valve regurgitation is trivial. No evidence of pulmonic stenosis. Aorta: The aortic root is normal in size and structure. Venous: The inferior vena cava is normal in size with greater than 50%  respiratory variability, suggesting right atrial pressure of 3 mmHg. IAS/Shunts: The interatrial septum was not well visualized.  LEFT VENTRICLE PLAX 2D LVIDd:         3.70 cm LVIDs:         2.70 cm LV PW:         1.10 cm LV IVS:        1.30 cm LVOT diam:     1.90 cm LV SV:         44 LV SV Index:   24 LVOT Area:     2.84 cm  RIGHT VENTRICLE RV S prime:     13.50 cm/s TAPSE (M-mode): 1.8 cm LEFT ATRIUM           Index       RIGHT ATRIUM           Index LA diam:      3.90 cm 2.14 cm/m  RA Area:     18.00 cm LA Vol (A4C): 43.8 ml 24.04 ml/m RA Volume:   40.70 ml  22.34 ml/m  AORTIC VALVE AV Area (Vmax):    0.95 cm AV Area (Vmean):   1.00 cm AV Area (VTI):     0.93 cm AV Vmax:           245.00 cm/s AV Vmean:          177.925 cm/s AV VTI:            0.470 m AV Peak Grad:      24.0 mmHg AV Mean Grad:      15.2 mmHg LVOT Vmax:         82.50 cm/s LVOT Vmean:         62.500 cm/s LVOT VTI:          0.155 m LVOT/AV VTI ratio: 0.33  AORTA Ao Root diam: 2.90 cm  SHUNTS Systemic VTI:  0.16 m Systemic Diam: 1.90 cm Jenkins Rouge MD Electronically signed by Jenkins Rouge MD Signature Date/Time: 11/24/2019/12:57:24 PM    Final    VAS [place] LOWER EXTREMITY VENOUS (DVT)  Result Date: 11/24/2019  Lower Venous DVTStudy Indications: Thrombocytopenia.  Risk Factors: None identified. Comparison Study: No prior studies. Performing Technologist: Oliver Hum RVT  Examination Guidelines: A complete evaluation includes B-mode imaging, spectral Doppler, color Doppler, and power Doppler as needed of all accessible portions of each vessel. Bilateral testing is considered an integral part of a complete examination. Limited examinations for reoccurring indications may be performed as noted. The reflux portion of the exam is performed with the patient in reverse Trendelenburg.  +---------+---------------+---------+-----------+----------+--------------+ RIGHT    CompressibilityPhasicitySpontaneityPropertiesThrombus Aging +---------+---------------+---------+-----------+----------+--------------+ CFV      Full           Yes      Yes                                 +---------+---------------+---------+-----------+----------+--------------+ SFJ      Full                                                        +---------+---------------+---------+-----------+----------+--------------+ FV Prox  Full                                                        +---------+---------------+---------+-----------+----------+--------------+  FV Mid   Full                                                        +---------+---------------+---------+-----------+----------+--------------+ FV DistalFull                                                        +---------+---------------+---------+-----------+----------+--------------+ PFV      Full                                                         +---------+---------------+---------+-----------+----------+--------------+ POP      Full           Yes      Yes                                 +---------+---------------+---------+-----------+----------+--------------+ PTV      Full                                                        +---------+---------------+---------+-----------+----------+--------------+ PERO     Partial                                      Acute          +---------+---------------+---------+-----------+----------+--------------+ Gastroc  None                                         Acute          +---------+---------------+---------+-----------+----------+--------------+   +---------+---------------+---------+-----------+----------+--------------+ LEFT     CompressibilityPhasicitySpontaneityPropertiesThrombus Aging +---------+---------------+---------+-----------+----------+--------------+ CFV      Full           Yes      Yes                                 +---------+---------------+---------+-----------+----------+--------------+ SFJ      Full                                                        +---------+---------------+---------+-----------+----------+--------------+ FV Prox  Full                                                        +---------+---------------+---------+-----------+----------+--------------+  FV Mid   Full                                                        +---------+---------------+---------+-----------+----------+--------------+ FV DistalFull                                                        +---------+---------------+---------+-----------+----------+--------------+ PFV      Full                                                        +---------+---------------+---------+-----------+----------+--------------+ POP      Full           Yes      Yes                                  +---------+---------------+---------+-----------+----------+--------------+ PTV      Full                                                        +---------+---------------+---------+-----------+----------+--------------+ PERO     Full                                                        +---------+---------------+---------+-----------+----------+--------------+     Summary: RIGHT: - Findings consistent with acute deep vein thrombosis involving the right peroneal veins, and right gastrocnemius veins. - No cystic structure found in the popliteal fossa.  LEFT: - There is no evidence of deep vein thrombosis in the lower extremity.  *See table(s) above for measurements and observations. Electronically signed by Ruta Hinds MD on 11/24/2019 at 4:06:31 PM.    Final    Gaylyn Lambert ACNP Acute Care Nurse Practitioner [place] Please consult Amion 11/25/2019, 11:57 AM

## 2019-11-26 ENCOUNTER — Inpatient Hospital Stay (HOSPITAL_COMMUNITY): Payer: Medicare HMO

## 2019-11-26 LAB — BASIC METABOLIC PANEL
Anion gap: 7 (ref 5–15)
BUN: 60 mg/dL — ABNORMAL HIGH (ref 8–23)
CO2: 33 mmol/L — ABNORMAL HIGH (ref 22–32)
Calcium: 8.1 mg/dL — ABNORMAL LOW (ref 8.9–10.3)
Chloride: 108 mmol/L (ref 98–111)
Creatinine, Ser: 1.42 mg/dL — ABNORMAL HIGH (ref 0.61–1.24)
GFR calc Af Amer: 55 mL/min — ABNORMAL LOW (ref >60)
GFR calc non Af Amer: 47 mL/min — ABNORMAL LOW (ref >60)
Glucose, Bld: 200 mg/dL — ABNORMAL HIGH (ref 70–99)
Potassium: 4 mmol/L (ref 3.5–5.1)
Sodium: 148 mmol/L — ABNORMAL HIGH (ref 135–145)

## 2019-11-26 LAB — APTT: aPTT: 66 seconds — ABNORMAL HIGH (ref 24–36)

## 2019-11-26 LAB — CBC WITH DIFFERENTIAL/PLATELET
Abs Immature Granulocytes: 0.77 10*3/uL — ABNORMAL HIGH (ref 0.00–0.07)
Basophils Absolute: 0.1 10*3/uL (ref 0.0–0.1)
Basophils Relative: 1 %
Eosinophils Absolute: 1.1 10*3/uL — ABNORMAL HIGH (ref 0.0–0.5)
Eosinophils Relative: 7 %
HCT: 36.5 % — ABNORMAL LOW (ref 39.0–52.0)
Hemoglobin: 11.2 g/dL — ABNORMAL LOW (ref 13.0–17.0)
Immature Granulocytes: 4 %
Lymphocytes Relative: 7 %
Lymphs Abs: 1.2 10*3/uL (ref 0.7–4.0)
MCH: 29.2 pg (ref 26.0–34.0)
MCHC: 30.7 g/dL (ref 30.0–36.0)
MCV: 95.1 fL (ref 80.0–100.0)
Monocytes Absolute: 1.2 10*3/uL — ABNORMAL HIGH (ref 0.1–1.0)
Monocytes Relative: 7 %
Neutro Abs: 12.9 10*3/uL — ABNORMAL HIGH (ref 1.7–7.7)
Neutrophils Relative %: 74 %
Platelets: 185 10*3/uL (ref 150–400)
RBC: 3.84 MIL/uL — ABNORMAL LOW (ref 4.22–5.81)
RDW: 13.3 % (ref 11.5–15.5)
WBC: 17.4 10*3/uL — ABNORMAL HIGH (ref 4.0–10.5)
nRBC: 0 % (ref 0.0–0.2)

## 2019-11-26 LAB — LIPID PANEL
Cholesterol: 99 mg/dL (ref 0–200)
HDL: 32 mg/dL — ABNORMAL LOW (ref >40)
LDL Cholesterol: 48 mg/dL (ref 0–99)
Total CHOL/HDL Ratio: 3.1 RATIO
Triglycerides: 95 mg/dL (ref <150)
VLDL: 19 mg/dL (ref 0–40)

## 2019-11-26 LAB — GLUCOSE, CAPILLARY
Glucose-Capillary: 165 mg/dL — ABNORMAL HIGH (ref 70–99)
Glucose-Capillary: 172 mg/dL — ABNORMAL HIGH (ref 70–99)
Glucose-Capillary: 198 mg/dL — ABNORMAL HIGH (ref 70–99)
Glucose-Capillary: 208 mg/dL — ABNORMAL HIGH (ref 70–99)
Glucose-Capillary: 227 mg/dL — ABNORMAL HIGH (ref 70–99)
Glucose-Capillary: 235 mg/dL — ABNORMAL HIGH (ref 70–99)

## 2019-11-26 LAB — PHOSPHORUS: Phosphorus: 3.2 mg/dL (ref 2.5–4.6)

## 2019-11-26 LAB — PROCALCITONIN: Procalcitonin: 0.42 ng/mL

## 2019-11-26 LAB — SEROTONIN RELEASE ASSAY (SRA)
SRA .2 IU/mL UFH Ser-aCnc: <1 % (ref 0–20)
SRA 100IU/mL UFH Ser-aCnc: <1 % (ref 0–20)

## 2019-11-26 LAB — TRIGLYCERIDES: Triglycerides: 97 mg/dL (ref <150)

## 2019-11-26 LAB — MAGNESIUM: Magnesium: 2.5 mg/dL — ABNORMAL HIGH (ref 1.7–2.4)

## 2019-11-26 LAB — HEPARIN LEVEL (UNFRACTIONATED): Heparin Unfractionated: 0.12 IU/mL — ABNORMAL LOW (ref 0.30–0.70)

## 2019-11-26 MED ORDER — METOPROLOL TARTRATE 25 MG/10 ML ORAL SUSPENSION
12.5000 mg | Freq: Four times a day (QID) | ORAL | Status: DC
  Administered 2019-11-26 – 2019-11-30 (×14): 12.5 mg
  Filled 2019-11-26 (×19): qty 5

## 2019-11-26 MED ORDER — HEPARIN BOLUS VIA INFUSION
2000.0000 [IU] | Freq: Once | INTRAVENOUS | Status: AC
  Administered 2019-11-26: 2000 [IU] via INTRAVENOUS
  Filled 2019-11-26: qty 2000

## 2019-11-26 MED ORDER — HEPARIN (PORCINE) 25000 UT/250ML-% IV SOLN
1400.0000 [IU]/h | INTRAVENOUS | Status: DC
  Administered 2019-11-26 – 2019-11-30 (×5): 1400 [IU]/h via INTRAVENOUS
  Filled 2019-11-26 (×5): qty 250

## 2019-11-26 MED ORDER — HEPARIN (PORCINE) 25000 UT/250ML-% IV SOLN
1200.0000 [IU]/h | INTRAVENOUS | Status: DC
  Administered 2019-11-26: 1200 [IU]/h via INTRAVENOUS
  Filled 2019-11-26: qty 250

## 2019-11-26 MED ORDER — LIP MEDEX EX OINT
TOPICAL_OINTMENT | CUTANEOUS | Status: AC
  Filled 2019-11-26: qty 7

## 2019-11-26 MED ORDER — METOPROLOL TARTRATE 5 MG/5ML IV SOLN
3.0000 mg | Freq: Once | INTRAVENOUS | Status: AC
  Administered 2019-11-26: 3 mg via INTRAVENOUS
  Filled 2019-11-26: qty 5

## 2019-11-26 MED ORDER — FUROSEMIDE 10 MG/ML IJ SOLN
60.0000 mg | Freq: Once | INTRAMUSCULAR | Status: AC
  Administered 2019-11-26: 60 mg via INTRAVENOUS
  Filled 2019-11-26: qty 6

## 2019-11-26 NOTE — Progress Notes (Signed)
NAME:  Jeffrey Obrien, MRN:  [id number], DOB:  04-20-1941, LOS: 6 ADMISSION DATE:  11/04/2019, CONSULTATION DATE:  3/20 REFERRING MD:  [facility], CHIEF COMPLAINT:  Dyspnea   Brief History   79 y/o male admitted on 3/20 from [facility] where he was admitted for pneumonia causing severe acute respiratory failure with hypoxemia on 3/15.    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   has no past surgical history on file.   Significant Hospital Events   3/15 admission [facility] 3/20 admission [facility], intubation, prone position 3/23- Heavily sedated. FiO2 needs down  Consults:  none  Procedures:  3/20 ETT >  3/20 L IJ CVL >   Significant Diagnostic Tests:  3/15 CT angiogram chest OSH> no PE, marked severity diffuse bilateral partially ground-glass appearing infiltrates. Small bilateral pleural effusions, R > L. Received his second COVID vaccine 3 weeks prior to admission.  3/16 TTE > Mild to moderate aortic regurgitation, severe aortic stenosis, peak gradient 54mHg, mild LVH, LVEF 55-60%, LA dilated  Micro Data:  3/15 SARS COV 2 > neg 3/18 SARS COV 2 > neg 3/15 blood culture > neg 3/20 sputum culture > GPC 3/20 urine strep > neg 3/20 urine legionella >  3/20 BAL > neg 3/20 BAL fungus >  3/20 BAL AFB >  3/20 blood > neg 3/21 RVP> rhinovirus  Antimicrobials:  3/20 vanc > off 3/20 cefepime >   Interim history/subjective:   Currently heavily sedated becomes asynchronous with ventilator when sedation decreased  Objective   Blood pressure (!) 143/44, pulse (!) 107, temperature 97.7 F (36.5 C), temperature source Axillary, resp. rate 14, height _0  (1.702 m), weight 72.4 kg, SpO2 94 %. CVP:  [8 mmHg-10 mmHg] 8 mmHg  Vent Mode: PSV;CPAP FiO2 (%):   [40 %-55 %] 40 % Set Rate:  [30 bmp] 30 bmp Vt Set:  [390 mL] 390 mL PEEP:  [5 cmH20-8 cmH20] 5 cmH20 Pressure Support:  [5 cmH20-10 cmH20] 10 cmH20 Plateau Pressure:  [13 cmH20-24 cmH20] 21 cmH20   Intake/Output Summary (Last 24 hours) at 11/26/2019 0831 Last data filed at 11/26/2019 0800 Gross per 24 hour  Intake 1427.48 ml  Output 3400 ml  Net -1972.52 ml   Filed Weights   11/24/19 0412 11/25/19 0500 11/26/19 0500  Weight: 71 kg 73.9 kg 72.4 kg   Vent Mode: PSV;CPAP FiO2 (%):  [40 %-55 %] 40 % Set Rate:  [30 bmp] 30 bmp Vt Set:  [390 mL] 390 mL PEEP:  [5 cmH20-8 cmH20] 5 cmH20 Pressure Support:  [5 cmH20-10 cmH20] 10 cmH20 Plateau Pressure:  [13 cLNL89-21cmH20] 21 cmH20  Examination: General: Elderly male who is heavily sedated HEENT: Endotracheal tube gastric tube in place Neuro: When not sedated moves all extremities CV: Heart sounds are distant PULM: Diminished throughout Vent pressure regulated volume control  FIO2 40% PEEP 5 RATE 30 VT 390  GI: soft, bsx4 active  Extremities: warm/dry, 1+ edema  Skin: no rashes or lesions       Resolved Hospital Problem list   Septic shock> resolved  Assessment & Plan:  Acute resp failure and ARDS from Rhinovirus pneumonia:     plan Continue ARDS protocol Wean as tolerated He may need a tracheostomy to be successfully liberated from full mechanical support    Need for sedation: still  very agitated, severe ventilator dyssynchrony  continue Dilaudid and propofol Daily wake-up assessment    AKI Lab Results  Component Value Date   CREATININE 1.42 (H) 11/26/2019   CREATININE 1.56 (H) 11/25/2019   CREATININE 1.46 (H) 11/24/2019     plan Monitor creatinine  HCAP?  plan Continue antibiotics Monitor new culture data   Hx of Hypertension and Severe aortic stenosis Severe AS by 2D echo EF 55% Troponin 5180 Plan Cardiology treat medically for now Argatroban to continue  Anemia of critical  illness  Recent Labs    11/25/19 0436 11/26/19 0501  HGB 11.0* 11.2*   Transfuse per protocol   Thromoboctyopenia - new on 3/24 142->168 Duplex study reveals acute deep vein thrombosis involving the right peroneal and gastrocnemius veins Plan Continue Argatroban Platelets are improving     DM2 CBG (last 3)  Recent Labs    11/25/19 2330 11/26/19 0336 11/26/19 0809  GLUCAP 221* 172* 165*    Sliding-scale insulin protocol    Best practice:  Diet:  TF Pain/Anxiety/Delirium protocol (if indicated): diprivan and dilaudid VAP protocol (if indicated): nHOB ? 30 DVT prophylaxis: lovenox GI prophylaxis: protonix Glucose control: ssi Mobility: bed rest Code Status: full Family Communication: Family updated daily  Disposition: remain in ICU       App cct 40 min  LABS    PULMONARY Recent Labs  Lab 12/01/2019 1547 11/11/2019 2200 11/21/19 1400 11/22/19 0300  PHART 7.179* 7.330* 7.273* 7.327*  PCO2ART 71.9* 46.4 52.8* 47.5  PO2ART 91.6 214* 120* 93.3  HCO3 25.7 23.9 23.6 24.2  O2SAT 92.9 99.1 98.7 97.1    CBC Recent Labs  Lab 11/23/19 0355 11/25/19 0436 11/26/19 0501  HGB 10.2* 11.0* 11.2*  HCT 32.2* 35.9* 36.5*  WBC 12.0* 17.3* 17.4*  PLT 142* 168 185    COAGULATION Recent Labs  Lab 11/24/19 1324  INR 1.1    CARDIAC  No results for input(s): TROPONINI in the last 168 hours. No results for input(s): PROBNP in the last 168 hours.   CHEMISTRY Recent Labs  Lab 11/21/19 0430 11/21/19 0430 11/21/19 1720 11/21/19 1834 11/21/19 1834 11/22/19 0300 11/23/19 0355 11/23/19 0355 11/24/19 0412 11/24/19 0412 11/25/19 0436 11/26/19 0501  NA 141   < >  --  142  --   --  142  --  144  --  146* 148*  K 4.8   < >  --  4.3   < >  --  3.9   < > 3.9   < > 4.2 4.0  CL 104   < >  --  105  --   --  108  --  111  --  108 108  CO2 26   < >  --  24  --   --  26  --  26  --  31 33*  GLUCOSE 256*   < >  --  207*  --   --  181*  --  150*  --  158* 200*    BUN 52*   < >  --  78*  --   --  70*  --  59*  --  57* 60*  CREATININE 2.46*   < >  --  3.37*  --   --  2.01*  --  1.46*  --  1.56* 1.42*  CALCIUM 8.2*   < >  --  8.0*  --   --  7.8*  --  7.9*  --  8.1* 8.1*  MG 2.6*  --  2.6*  --   --  2.7*  --   --  2.4  --   --  2.5*  PHOS 7.4*  --  7.8*  --   --  5.9*  --   --  3.3  --   --  3.2   < > = values in this interval not displayed.   Estimated Creatinine Clearance: 40.7 mL/min (A) (by C-G formula based on SCr of 1.42 mg/dL (H)).   LIVER Recent Labs  Lab 12/01/2019 1416 11/23/19 0355 11/24/19 1324 11/25/19 0436  AST 35 32  --  23  ALT 51* 32  --  22  ALKPHOS 171* 71  --  61  BILITOT 1.6* 0.6  --  0.7  PROT 7.2 5.3*  --  5.3*  ALBUMIN 3.2* 2.2*  --  2.1*  INR  --   --  1.1  --      INFECTIOUS Recent Labs  Lab 11/24/19 1014 11/25/19 0436 11/25/19 0445 11/26/19 0501  LATICACIDVEN  --   --  0.9  --   PROCALCITON 0.10 0.18  --  0.42     ENDOCRINE CBG (last 3)  Recent Labs    11/25/19 2330 11/26/19 0336 11/26/19 0809  GLUCAP 221* 172* 165*         IMAGING x48h  - image(s) personally visualized  -   highlighted in bold DG Chest Port 1 View  Result Date: 11/26/2019 CLINICAL DATA:  Respiratory failure EXAM: PORTABLE CHEST 1 VIEW COMPARISON:  None. FINDINGS: The heart size and mediastinal contours are stable. ETT is 2 cm above the carina. NG tube is seen below the diaphragm. A left-sided central venous catheter seen at the brachiocephalic SVC junction. There is slight interval worsening in the multifocal patchy airspace opacities throughout both lungs. No pneumothorax. No acute osseous abnormality. IMPRESSION: Interval slight worsening in the multifocal airspace opacities Electronically Signed   By: Prudencio Pair M.D.   On: 11/26/2019 05:26   DG CHEST PORT 1 VIEW  Result Date: 11/25/2019 CLINICAL DATA:  ARDS. Rule out pneumomediastinum. EXAM: PORTABLE CHEST 1 VIEW COMPARISON:  11/25/2019 FINDINGS: An endotracheal tube  terminates at the inferior margin of the clavicular heads. A left jugular catheter terminates over the upper SVC. The enteric tube courses into the abdomen with tip not imaged. The cardiomediastinal silhouette is unchanged. Widespread airspace opacities are again seen in the right greater than left lungs with mildly improved aeration bilaterally, particularly in the right midlung. No sizable pleural effusion, pneumothorax, or definite pneumomediastinum is identified. IMPRESSION: Mildly improved aeration of the lungs. No evidence of pneumomediastinum. Electronically Signed   By: Logan Bores M.D.   On: 11/25/2019 17:12   DG CHEST PORT 1 VIEW  Result Date: 11/25/2019 CLINICAL DATA:  Intubation.  ARDS. EXAM: PORTABLE CHEST 1 VIEW COMPARISON:  11/24/2019.  11/23/2019.  CT 11/15/2019. FINDINGS: Endotracheal tube, left IJ line, NG tube in stable position. Heart size normal. Diffuse severe bilateral pulmonary infiltrates are again noted. Progressive right lung atelectatic changes/infiltrates noted. Bibasilar atelectasis. No pleural effusion or pneumothorax. IMPRESSION: 1.  Lines and tubes in stable position. 2. Diffuse severe bilateral pulmonary infiltrates are again noted. Progressive right lung atelectatic changes/infiltrates noted. Bibasilar atelectasis. Electronically Signed   By: Marcello Moores  Register   On: 11/25/2019 07:07   DG Abd Portable 1V  Result Date: 11/24/2019 CLINICAL DATA:  Orogastric tube placement EXAM: PORTABLE ABDOMEN - 1 VIEW COMPARISON:  November 23, 2019 FINDINGS: Orogastric tube tip  is at the duodenal-jejunal junction. No bowel dilatation or air-fluid level noted to suggest bowel obstruction. No free air. Patchy airspace opacity in both lung bases noted. IMPRESSION: Orogastric tube tip at the duodenal-jejunal junction. No bowel obstruction or free air. Infiltrate in both lung bases. Electronically Signed   By: Lowella Grip III M.D.   On: 11/24/2019 11:02   ECHOCARDIOGRAM COMPLETE  Result  Date: 11/24/2019    ECHOCARDIOGRAM REPORT   Patient Name:   Zackery Barefoot Date of Exam: 11/24/2019 Medical Rec #:  [id number]  Height:       67.0 in Accession #:    [id number] Weight:       156.5 lb Date of Birth:  [birth year]   BSA:          1.822 m Patient Age:    67 years   BP:           128/61 mmHg Patient Gender: M          HR:           92 bpm. Exam Location:  Inpatient Procedure: 2D Echo STAT ECHO Indications:    424.1 aortic stenosis  History:        Patient has no prior history of Echocardiogram examinations. No                 hx on file (prior AS hx per echo not on file).  Sonographer:    Jannett Celestine RDCS (AE) Referring Phys: [place]  1. Septal hypokinesis . Left ventricular ejection fraction, by estimation, is 50 to 55%. The left ventricle has low normal function. The left ventricle has no regional wall motion abnormalities. The left ventricular internal cavity size was mildly dilated. There is moderate left ventricular hypertrophy of the basal-septal segment. Left ventricular diastolic parameters are indeterminate.  2. Right ventricular systolic function is normal. The right ventricular size is normal.  3. The mitral valve is normal in structure. No evidence of mitral valve regurgitation. No evidence of mitral stenosis.  4. Aortic valve appears tri cuspid with severe calcification and restricted motion Morphologically should have severe AS And suspect gradients are under estimated due to poor doppler signals. Also cannot r/o SBE given exuberant calcification Suggest further evaluation with TEE if clincially indicated . The aortic valve is tricuspid. Aortic valve regurgitation is trivial. Stenotic.  5. The inferior vena cava is normal in size with greater than 50% respiratory variability, suggesting right atrial pressure of 3 mmHg. FINDINGS  Left Ventricle: Septal hypokinesis. Left ventricular ejection fraction, by estimation, is 50 to 55%. The left ventricle has low normal function.  The left ventricle has no regional wall motion abnormalities. The left ventricular internal cavity size was mildly dilated. There is moderate left ventricular hypertrophy of the basal-septal segment. Left ventricular diastolic parameters are indeterminate. Right Ventricle: The right ventricular size is normal. No increase in right ventricular wall thickness. Right ventricular systolic function is normal. Left Atrium: Left atrial size was normal in size. Right Atrium: Right atrial size was normal in size. Pericardium: There is no evidence of pericardial effusion. Mitral Valve: The mitral valve is normal in structure. There is moderate thickening of the mitral valve leaflet(s). There is moderate calcification of the mitral valve leaflet(s). Normal mobility of the mitral valve leaflets. Moderate mitral annular calcification. No evidence of mitral valve regurgitation. No evidence of mitral valve stenosis. Tricuspid Valve: The tricuspid valve is normal in structure. Tricuspid valve regurgitation is mild . No evidence  of tricuspid stenosis. Aortic Valve: Aortic valve appears tri cuspid with severe calcification and restricted motion Morphologically should have severe AS And suspect gradients are under estimated due to poor doppler signals. Also cannot r/o SBE given exuberant calcification Suggest further evaluation with TEE if clincially indicated. The aortic valve is tricuspid. . There is severe thickening and severe calcifcation of the aortic valve. Aortic valve regurgitation is trivial. Stenotic. Severe aortic valve annular calcification. There is severe thickening of the aortic valve. There is severe calcifcation of the aortic valve. Aortic valve mean gradient measures 15.2 mmHg. Aortic valve peak gradient measures 24.0 mmHg. Aortic valve area, by VTI measures 0.93 cm. Pulmonic Valve: The pulmonic valve was normal in structure. Pulmonic valve regurgitation is trivial. No evidence of pulmonic stenosis. Aorta: The  aortic root is normal in size and structure. Venous: The inferior vena cava is normal in size with greater than 50% respiratory variability, suggesting right atrial pressure of 3 mmHg. IAS/Shunts: The interatrial septum was not well visualized.  LEFT VENTRICLE PLAX 2D LVIDd:         3.70 cm LVIDs:         2.70 cm LV PW:         1.10 cm LV IVS:        1.30 cm LVOT diam:     1.90 cm LV SV:         44 LV SV Index:   24 LVOT Area:     2.84 cm  RIGHT VENTRICLE RV S prime:     13.50 cm/s TAPSE (M-mode): 1.8 cm LEFT ATRIUM           Index       RIGHT ATRIUM           Index LA diam:      3.90 cm 2.14 cm/m  RA Area:     18.00 cm LA Vol (A4C): 43.8 ml 24.04 ml/m RA Volume:   40.70 ml  22.34 ml/m  AORTIC VALVE AV Area (Vmax):    0.95 cm AV Area (Vmean):   1.00 cm AV Area (VTI):     0.93 cm AV Vmax:           245.00 cm/s AV Vmean:          177.925 cm/s AV VTI:            0.470 m AV Peak Grad:      24.0 mmHg AV Mean Grad:      15.2 mmHg LVOT Vmax:         82.50 cm/s LVOT Vmean:        62.500 cm/s LVOT VTI:          0.155 m LVOT/AV VTI ratio: 0.33  AORTA Ao Root diam: 2.90 cm  SHUNTS Systemic VTI:  0.16 m Systemic Diam: 1.90 cm Jenkins Rouge MD Electronically signed by Jenkins Rouge MD Signature Date/Time: 11/24/2019/12:57:24 PM    Final    VAS [place] LOWER EXTREMITY VENOUS (DVT)  Result Date: 11/24/2019  Lower Venous DVTStudy Indications: Thrombocytopenia.  Risk Factors: None identified. Comparison Study: No prior studies. Performing Technologist: Oliver Hum RVT  Examination Guidelines: A complete evaluation includes B-mode imaging, spectral Doppler, color Doppler, and power Doppler as needed of all accessible portions of each vessel. Bilateral testing is considered an integral part of a complete examination. Limited examinations for reoccurring indications may be performed as noted. The reflux portion of the exam is performed with the patient in reverse Trendelenburg.   +---------+---------------+---------+-----------+----------+--------------+  RIGHT     Compressibility Phasicity Spontaneity Properties Thrombus Aging  +---------+---------------+---------+-----------+----------+--------------+  CFV       Full            Yes       Yes                                    +---------+---------------+---------+-----------+----------+--------------+  SFJ       Full                                                             +---------+---------------+---------+-----------+----------+--------------+  FV Prox   Full                                                             +---------+---------------+---------+-----------+----------+--------------+  FV Mid    Full                                                             +---------+---------------+---------+-----------+----------+--------------+  FV Distal Full                                                             +---------+---------------+---------+-----------+----------+--------------+  PFV       Full                                                             +---------+---------------+---------+-----------+----------+--------------+  POP       Full            Yes       Yes                                    +---------+---------------+---------+-----------+----------+--------------+  PTV       Full                                                             +---------+---------------+---------+-----------+----------+--------------+  PERO      Partial  Acute           +---------+---------------+---------+-----------+----------+--------------+  Gastroc   None                                             Acute           +---------+---------------+---------+-----------+----------+--------------+   +---------+---------------+---------+-----------+----------+--------------+  LEFT      Compressibility Phasicity Spontaneity Properties Thrombus Aging   +---------+---------------+---------+-----------+----------+--------------+  CFV       Full            Yes       Yes                                    +---------+---------------+---------+-----------+----------+--------------+  SFJ       Full                                                             +---------+---------------+---------+-----------+----------+--------------+  FV Prox   Full                                                             +---------+---------------+---------+-----------+----------+--------------+  FV Mid    Full                                                             +---------+---------------+---------+-----------+----------+--------------+  FV Distal Full                                                             +---------+---------------+---------+-----------+----------+--------------+  PFV       Full                                                             +---------+---------------+---------+-----------+----------+--------------+  POP       Full            Yes       Yes                                    +---------+---------------+---------+-----------+----------+--------------+  PTV       Full                                                             +---------+---------------+---------+-----------+----------+--------------+  PERO      Full                                                             +---------+---------------+---------+-----------+----------+--------------+     Summary: RIGHT: - Findings consistent with acute deep vein thrombosis involving the right peroneal veins, and right gastrocnemius veins. - No cystic structure found in the popliteal fossa.  LEFT: - There is no evidence of deep vein thrombosis in the lower extremity.  *See table(s) above for measurements and observations. Electronically signed by Ruta Hinds MD on 11/24/2019 at 4:06:31 PM.    Final    Gaylyn Lambert ACNP Acute Care Nurse Practitioner [place] Please  consult Amion 11/26/2019, 8:31 AM

## 2019-11-26 NOTE — Progress Notes (Signed)
[place] for heparin Indication: DVT   Briefly, Pt admitted on 3/20 and currently on Heparin IV for VTE treatment (see full note from earlier today).  He was changed from Argatroban to Heparin earlier today since HIT Ab and SRA are negative. Heparin level 0.12, subtherapeutic on heparin at 1200 units/hr. No bleeding or complications reported.   Goal of Therapy:  Heparin level 0.3-0.7 units/ml Monitor platelets by anticoagulation protocol: Yes   Plan:  Give heparin 2000 units bolus IV x 1 Increase to heparin IV infusion at 1400 units/hr Heparin level 8 hours after starting Daily heparin level and CBC  Gretta Arab PharmD, BCPS Pager: [phone] 11/26/2019 5:38 PM

## 2019-11-26 NOTE — Progress Notes (Signed)
Pharmacy - Argatroban  Assessment:    Please see note from Leodis Sias, PharmD earlier today for full details.  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   4th aPTT remains therapeutic at 66 sec (goal 50-90 sec) on 0.6 mcg/kg/min (3rd consecutive therapeutic aPTT)  No bleeding or infusion issues per RN  Plan:   Continue argatroban at 0.6 mcg/kg/min  Recheck aPTT at least daily  APTT q2 hr until two therapeutic levels   Adrian Saran, PharmD, BCPS 11/26/2019 5:32 AM

## 2019-11-26 NOTE — Progress Notes (Signed)
Still tacy HR 120s  Plan  increase lopressor from 12.5mg  bid to q6h    SIGNATURE    Dr. Brand Males, M.D., F.C.C.P,  Pulmonary and Critical Care Medicine Staff Physician, [place] Director - Interstitial Lung Disease  Program  Pulmonary [place] [facility] [place], [place], [address]  Pager: [phone], If no answer or between  15:00h - 7:00h: call [phone] Telephone: [phone]  6:43 PM 11/26/2019

## 2019-11-26 NOTE — Progress Notes (Signed)
NAME:  Jeffrey Obrien, MRN:  [id number], DOB:  04-19-1941, LOS: 6 ADMISSION DATE:  11/08/2019, CONSULTATION DATE:  3/20 REFERRING MD:  Jeffrey Obrien, CHIEF COMPLAINT:  Dyspnea   Brief History   79 y/o male admitted on 3/20 from [facility] where he was admitted for pneumonia causing severe acute respiratory failure with hypoxemia on 3/15.    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   has no past surgical history on file.   Significant Obrien Events   3/15 admission [facility]  3/20 admission [facility]rien, intubation, prone position  3/23- Heavily sedated. FiO2 needs down  11/24/2019:-Known supine ventilation with 40% FiO2.  On propofol and Dilaudid infusions.  On wake up assessment and spontaneous breathing trial quickly became tachypneic and failed attempted spontaneous breathing trial.  Not on pressors.  Making urine.  Improved Creat. White count coming down.  11/25/19 - Air.  Traps and has abdominal chest wall paradoxus when sedation decreased.  Cardiology consult recommend scheduled Lopressor.  Possible TEE recommended just prior to extubation to evaluate aortic valve for stenosis.  Outpatient cardiac cath recommended.  Consults:  none  Procedures:  3/20 ETT >  3/20 L IJ CVL >   Significant Diagnostic Tests:  3/15 CT angiogram chest OSH> no PE, marked severity diffuse bilateral partially ground-glass appearing infiltrates. Small bilateral pleural effusions, R > L. Received his second COVID vaccine 3 weeks prior to admission.  3/16 TTE > Mild to moderate aortic regurgitation, severe aortic stenosis, peak gradient 20mHg, mild LVH, LVEF 55-60%, LA dilated  Micro Data:  3/15 SARS COV 2 > neg 3/18 SARS COV 2 > neg 3/15 blood culture > neg 3/20 sputum culture >  GPC 3/20 urine strep >  neg 3/20 urine legionella >  3/20 BAL >  3/20 BAL fungus >  3/20 BAL AFB >  3/20 blood >  3/21 RVP> rhinovirus  Antimicrobials:  3/20 vanc >  3/24 3/20 cefepime >    Interim history/subjective:   11/26/2019= afebrile.  Last fever 2 days ago.  On 40% oxygen and 8 of PEEP.  Currently on pressure support 10/5 but well sedated with Dilaudid and propofol.  Pulse ox 89-90%.  Made good urine with Lasix yesterday.  Blood pressure adequate.  Not on pressors.  On [place] for DVT  Results for Jeffrey Obrien(MRN [id number] as of 11/26/2019 08:41  Ref. Range 11/24/2019 13:24  Heparin Induced Platelet Ab Latest Ref Range: 0.000 - 0.400 OD 0.237    Objective   Blood pressure (!) 143/44, pulse (!) 107, temperature 97.7 F (36.5 C), temperature source Axillary, resp. rate 14, height _0  (1.702 m), weight 72.4 kg, SpO2 94 %. CVP:  [8 mmHg-10 mmHg] 8 mmHg  Vent Mode: PSV;CPAP FiO2 (%):  [40 %-55 %] 40 % Set Rate:  [30 bmp] 30 bmp Vt Set:  [390 mL] 390 mL PEEP:  [5 cmH20-8 cmH20] 5 cmH20 Pressure Support:  [5 cmH20-8 cmH20] 8 cmH20 Plateau Pressure:  [13 cmH20-24 cmH20] 21 cmH20   Intake/Output Summary (Last 24 hours) at 11/26/2019 0837 Last data filed at 11/26/2019 0800 Gross per 24 hour  Intake 1427.48 ml  Output 3400 ml  Net -1972.52 ml   Filed Weights   11/24/19 0412 11/25/19 0500 11/26/19 0500  Weight: 71 kg 73.9 kg 72.4 kg   Vent Mode: PSV;CPAP  FiO2 (%):  [40 %-55 %] 40 % Set Rate:  [30 bmp] 30 bmp Vt Set:  [390 mL] 390 mL PEEP:  [5 cmH20-8 cmH20] 5 cmH20 Pressure Support:  [5 cmH20-8 cmH20] 8 cmH20 Plateau Pressure:  [13 ZOX09-60 cmH20] 21 cmH20  Examination: General Appearance:  Looks criticall ill Head:  Normocephalic, without obvious abnormality, atraumatic Eyes:  PERRL - yes, conjunctiva/corneas - muddy     Ears:  Normal external ear canals, both ears Nose:  G tube - no Throat:  ETT TUBE - yes , OG tube - yes Neck:  Supple,  No  enlargement/tenderness/nodules Lungs: Clear to auscultation bilaterally, Ventilator   Synchrony - yes on PSV now Heart:  S1 and S2 normal, no murmur, CVP - no.  Pressors - no Abdomen:  Soft, no masses, no organomegaly Genitalia / Rectal:  Not done Extremities:  Extremities- intact Skin:  ntact in exposed areas . Sacral area - no eddema Neurologic:  Sedation - diprivan and dilaudi -> RASS - -3    Resolved Obrien Problem list   Septic shock> resolved  Assessment & Plan:  Acute resp failure and ARDS from Rhinovirus pneumonia:   11/26/2019  - doing PSB but this is on sedation gtt  plan ARDS protocol - ok to reduce PRVC rate from 33 to 18/m.  No extubation  Do PSV on lower sedation with progress in wake up assessment   Need for sedation: still very agitated, severe ventilator dyssynchrony  on  Dilaudid and propofol  11/26/2019 - WUA in progress  Plan RASS goal 0 to -2 with vent sync Continue dilaudid and diprivan  AKI and volume loverlad  11/26/2019 - stabilized at creat 1.56. CVP 11. + 3.6 L since admit  plan Continue to monitor Redo lasix  X 1 (doing daily needs assessment de to aortic stenosis hx)  HCAP?   11/26/2019 - no major fever x 48h. Cutlure negative so far  plan Monitor culture data Antibiotics cefepime and complete 7 days since 11/10/2019    Hx of Hypertension and Severe aortic stenosis Severe AS by 2D echo EF 55%    - Troponin 5180 3/25. Clinically stable with low dose scheduled lopressor per cards since 11/24/19   Plan Possible TEE recommended prior to extubation Cath recommended as opd Asa 81   Anemia of critical illness  11/26/2019 - no bleeding. Stable anemia  Plan  - - PRBC for hgb </= 6.9gm%    - exceptions are   -  if ACS susepcted/confirmed then transfuse for hgb </= 8.0gm%,  or    -  active bleeding with hemodynamic instability, then transfuse regardless of hemoglobin value   At at all times try to transfuse 1 unit prbc as possible  with exception of active hemorrhage    Thromoboctyopenia - new on 3/24 142->168 Duplex study reveals acute deep vein thrombosis involving the right peroneal and gastrocnemius veins  11/26/2019 - no bleeding. Platelet normals. HITT ab negative  Plan Change argatroban to IV heparin gtt    DM2 CBG (last 3)  Recent Labs    11/25/19 2330 11/26/19 0336 11/26/19 0809  GLUCAP 221* 172* 165*    Continue sliding scale insulin protocol    Best practice:  Diet:  TF Pain/Anxiety/Delirium protocol (if indicated): diprivan and dilaudid VAP protocol (if indicated): nHOB ? 30 DVT prophylaxis: IV heparin Rx for DVT (stopping argatroban 3/26) GI prophylaxis: protonix Glucose control: ssi Mobility: bed rest Code Status: full Family Communication: 11/25/2019 family updated at bedside  Disposition:  remain in [place]   The patient Jeffrey Obrien is critically ill with multiple organ systems failure and requires high complexity decision making for assessment and support, frequent evaluation and titration of therapies, application of advanced monitoring technologies and extensive interpretation of multiple databases.   Critical Care Time devoted to patient care services described in this note is  31  Minutes. This time reflects time of care of this signee Dr Brand Males. This critical care time does not reflect procedure time, or teaching time or supervisory time of PA/NP/Med student/Med Resident etc but could involve care discussion time     Dr. Brand Males, M.D., [facility].C.P Pulmonary and Critical Care Medicine Staff Physician [place] [facility] Pager: [phone], If no answer or between  15:00h - 7:00h: call [phone]  11/26/2019 8:55 AM      LABS    PULMONARY Recent Labs  Lab 11/12/2019 1547 11/28/2019 2200 11/21/19 1400 11/22/19 0300  PHART 7.179* 7.330* 7.273* 7.327*  PCO2ART 71.9* 46.4 52.8*  47.5  PO2ART 91.6 214* 120* 93.3  HCO3 25.7 23.9 23.6 24.2  O2SAT 92.9 99.1 98.7 97.1    CBC Recent Labs  Lab 11/23/19 0355 11/25/19 0436 11/26/19 0501  HGB 10.2* 11.0* 11.2*  HCT 32.2* 35.9* 36.5*  WBC 12.0* 17.3* 17.4*  PLT 142* 168 185    COAGULATION Recent Labs  Lab 11/24/19 1324  INR 1.1    CARDIAC  No results for input(s): TROPONINI in the last 168 hours. No results for input(s): PROBNP in the last 168 hours.   CHEMISTRY Recent Labs  Lab 11/21/19 0430 11/21/19 0430 11/21/19 1720 11/21/19 1834 11/21/19 1834 11/22/19 0300 11/23/19 0355 11/23/19 0355 11/24/19 0412 11/24/19 0412 11/25/19 0436 11/26/19 0501  NA 141   < >  --  142  --   --  142  --  144  --  146* 148*  K 4.8   < >  --  4.3   < >  --  3.9   < > 3.9   < > 4.2 4.0  CL 104   < >  --  105  --   --  108  --  111  --  108 108  CO2 26   < >  --  24  --   --  26  --  26  --  31 33*  GLUCOSE 256*   < >  --  207*  --   --  181*  --  150*  --  158* 200*  BUN 52*   < >  --  78*  --   --  70*  --  59*  --  57* 60*  CREATININE 2.46*   < >  --  3.37*  --   --  2.01*  --  1.46*  --  1.56* 1.42*  CALCIUM 8.2*   < >  --  8.0*  --   --  7.8*  --  7.9*  --  8.1* 8.1*  MG 2.6*  --  2.6*  --   --  2.7*  --   --  2.4  --   --  2.5*  PHOS 7.4*  --  7.8*  --   --  5.9*  --   --  3.3  --   --  3.2   < > = values in this interval not displayed.  Estimated Creatinine Clearance: 40.7 mL/min (A) (by C-G formula based on SCr of 1.42 mg/dL (H)).   LIVER Recent Labs  Lab 11/18/2019 1416 11/23/19 0355 11/24/19 1324 11/25/19 0436  AST 35 32  --  23  ALT 51* 32  --  22  ALKPHOS 171* 71  --  61  BILITOT 1.6* 0.6  --  0.7  PROT 7.2 5.3*  --  5.3*  ALBUMIN 3.2* 2.2*  --  2.1*  INR  --   --  1.1  --      INFECTIOUS Recent Labs  Lab 11/24/19 1014 11/25/19 0436 11/25/19 0445 11/26/19 0501  LATICACIDVEN  --   --  0.9  --   PROCALCITON 0.10 0.18  --  0.42     ENDOCRINE CBG (last 3)  Recent Labs     11/25/19 2330 11/26/19 0336 11/26/19 0809  GLUCAP 221* 172* 165*         IMAGING x48h  - image(s) personally visualized  -   highlighted in bold DG Chest Port 1 View  Result Date: 11/26/2019 CLINICAL DATA:  Respiratory failure EXAM: PORTABLE CHEST 1 VIEW COMPARISON:  None. FINDINGS: The heart size and mediastinal contours are stable. ETT is 2 cm above the carina. NG tube is seen below the diaphragm. A left-sided central venous catheter seen at the brachiocephalic SVC junction. There is slight interval worsening in the multifocal patchy airspace opacities throughout both lungs. No pneumothorax. No acute osseous abnormality. IMPRESSION: Interval slight worsening in the multifocal airspace opacities Electronically Signed   By: Prudencio Pair M.D.   On: 11/26/2019 05:26   DG CHEST PORT 1 VIEW  Result Date: 11/25/2019 CLINICAL DATA:  ARDS. Rule out pneumomediastinum. EXAM: PORTABLE CHEST 1 VIEW COMPARISON:  11/25/2019 FINDINGS: An endotracheal tube terminates at the inferior margin of the clavicular heads. A left jugular catheter terminates over the upper SVC. The enteric tube courses into the abdomen with tip not imaged. The cardiomediastinal silhouette is unchanged. Widespread airspace opacities are again seen in the right greater than left lungs with mildly improved aeration bilaterally, particularly in the right midlung. No sizable pleural effusion, pneumothorax, or definite pneumomediastinum is identified. IMPRESSION: Mildly improved aeration of the lungs. No evidence of pneumomediastinum. Electronically Signed   By: Logan Bores M.D.   On: 11/25/2019 17:12   DG CHEST PORT 1 VIEW  Result Date: 11/25/2019 CLINICAL DATA:  Intubation.  ARDS. EXAM: PORTABLE CHEST 1 VIEW COMPARISON:  11/24/2019.  11/23/2019.  CT 11/15/2019. FINDINGS: Endotracheal tube, left IJ line, NG tube in stable position. Heart size normal. Diffuse severe bilateral pulmonary infiltrates are again noted. Progressive right lung  atelectatic changes/infiltrates noted. Bibasilar atelectasis. No pleural effusion or pneumothorax. IMPRESSION: 1.  Lines and tubes in stable position. 2. Diffuse severe bilateral pulmonary infiltrates are again noted. Progressive right lung atelectatic changes/infiltrates noted. Bibasilar atelectasis. Electronically Signed   By: Marcello Moores  Register   On: 11/25/2019 07:07   DG Abd Portable 1V  Result Date: 11/24/2019 CLINICAL DATA:  Orogastric tube placement EXAM: PORTABLE ABDOMEN - 1 VIEW COMPARISON:  November 23, 2019 FINDINGS: Orogastric tube tip is at the duodenal-jejunal junction. No bowel dilatation or air-fluid level noted to suggest bowel obstruction. No free air. Patchy airspace opacity in both lung bases noted. IMPRESSION: Orogastric tube tip at the duodenal-jejunal junction. No bowel obstruction or free air. Infiltrate in both lung bases. Electronically Signed   By: Lowella Grip III M.D.   On: 11/24/2019 11:02   ECHOCARDIOGRAM COMPLETE  Result  Date: 11/24/2019    ECHOCARDIOGRAM REPORT   Patient Name:   Jeffrey Obrien Date of Exam: 11/24/2019 Medical Rec #:  [id number]  Height:       67.0 in Accession #:    [id number] Weight:       156.5 lb Date of Birth:  Jul 31, 1941   BSA:          1.822 m Patient Age:    56 years   BP:           128/61 mmHg Patient Gender: M          HR:           92 bpm. Exam Location:  Inpatient Procedure: 2D Echo STAT ECHO Indications:    424.1 aortic stenosis  History:        Patient has no prior history of Echocardiogram examinations. No                 hx on file (prior AS hx per echo not on file).  Sonographer:    Jannett Celestine RDCS (AE) Referring Phys: [place]  1. Septal hypokinesis . Left ventricular ejection fraction, by estimation, is 50 to 55%. The left ventricle has low normal function. The left ventricle has no regional wall motion abnormalities. The left ventricular internal cavity size was mildly dilated. There is moderate left ventricular  hypertrophy of the basal-septal segment. Left ventricular diastolic parameters are indeterminate.  2. Right ventricular systolic function is normal. The right ventricular size is normal.  3. The mitral valve is normal in structure. No evidence of mitral valve regurgitation. No evidence of mitral stenosis.  4. Aortic valve appears tri cuspid with severe calcification and restricted motion Morphologically should have severe AS And suspect gradients are under estimated due to poor doppler signals. Also cannot r/o SBE given exuberant calcification Suggest further evaluation with TEE if clincially indicated . The aortic valve is tricuspid. Aortic valve regurgitation is trivial. Stenotic.  5. The inferior vena cava is normal in size with greater than 50% respiratory variability, suggesting right atrial pressure of 3 mmHg. FINDINGS  Left Ventricle: Septal hypokinesis. Left ventricular ejection fraction, by estimation, is 50 to 55%. The left ventricle has low normal function. The left ventricle has no regional wall motion abnormalities. The left ventricular internal cavity size was mildly dilated. There is moderate left ventricular hypertrophy of the basal-septal segment. Left ventricular diastolic parameters are indeterminate. Right Ventricle: The right ventricular size is normal. No increase in right ventricular wall thickness. Right ventricular systolic function is normal. Left Atrium: Left atrial size was normal in size. Right Atrium: Right atrial size was normal in size. Pericardium: There is no evidence of pericardial effusion. Mitral Valve: The mitral valve is normal in structure. There is moderate thickening of the mitral valve leaflet(s). There is moderate calcification of the mitral valve leaflet(s). Normal mobility of the mitral valve leaflets. Moderate mitral annular calcification. No evidence of mitral valve regurgitation. No evidence of mitral valve stenosis. Tricuspid Valve: The tricuspid valve is normal in  structure. Tricuspid valve regurgitation is mild . No evidence of tricuspid stenosis. Aortic Valve: Aortic valve appears tri cuspid with severe calcification and restricted motion Morphologically should have severe AS And suspect gradients are under estimated due to poor doppler signals. Also cannot r/o SBE given exuberant calcification Suggest further evaluation with TEE if clincially indicated. The aortic valve is tricuspid. . There is severe thickening and severe calcifcation of the aortic valve. Aortic valve regurgitation  is trivial. Stenotic. Severe aortic valve annular calcification. There is severe thickening of the aortic valve. There is severe calcifcation of the aortic valve. Aortic valve mean gradient measures 15.2 mmHg. Aortic valve peak gradient measures 24.0 mmHg. Aortic valve area, by VTI measures 0.93 cm. Pulmonic Valve: The pulmonic valve was normal in structure. Pulmonic valve regurgitation is trivial. No evidence of pulmonic stenosis. Aorta: The aortic root is normal in size and structure. Venous: The inferior vena cava is normal in size with greater than 50% respiratory variability, suggesting right atrial pressure of 3 mmHg. IAS/Shunts: The interatrial septum was not well visualized.  LEFT VENTRICLE PLAX 2D LVIDd:         3.70 cm LVIDs:         2.70 cm LV PW:         1.10 cm LV IVS:        1.30 cm LVOT diam:     1.90 cm LV SV:         44 LV SV Index:   24 LVOT Area:     2.84 cm  RIGHT VENTRICLE RV S prime:     13.50 cm/s TAPSE (M-mode): 1.8 cm LEFT ATRIUM           Index       RIGHT ATRIUM           Index LA diam:      3.90 cm 2.14 cm/m  RA Area:     18.00 cm LA Vol (A4C): 43.8 ml 24.04 ml/m RA Volume:   40.70 ml  22.34 ml/m  AORTIC VALVE AV Area (Vmax):    0.95 cm AV Area (Vmean):   1.00 cm AV Area (VTI):     0.93 cm AV Vmax:           245.00 cm/s AV Vmean:          177.925 cm/s AV VTI:            0.470 m AV Peak Grad:      24.0 mmHg AV Mean Grad:      15.2 mmHg LVOT Vmax:          82.50 cm/s LVOT Vmean:        62.500 cm/s LVOT VTI:          0.155 m LVOT/AV VTI ratio: 0.33  AORTA Ao Root diam: 2.90 cm  SHUNTS Systemic VTI:  0.16 m Systemic Diam: 1.90 cm Jenkins Rouge MD Electronically signed by Jenkins Rouge MD Signature Date/Time: 11/24/2019/12:57:24 PM    Final    VAS [place] LOWER EXTREMITY VENOUS (DVT)  Result Date: 11/24/2019  Lower Venous DVTStudy Indications: Thrombocytopenia.  Risk Factors: None identified. Comparison Study: No prior studies. Performing Technologist: Oliver Hum RVT  Examination Guidelines: A complete evaluation includes B-mode imaging, spectral Doppler, color Doppler, and power Doppler as needed of all accessible portions of each vessel. Bilateral testing is considered an integral part of a complete examination. Limited examinations for reoccurring indications may be performed as noted. The reflux portion of the exam is performed with the patient in reverse Trendelenburg.  +---------+---------------+---------+-----------+----------+--------------+ RIGHT    CompressibilityPhasicitySpontaneityPropertiesThrombus Aging +---------+---------------+---------+-----------+----------+--------------+ CFV      Full           Yes      Yes                                 +---------+---------------+---------+-----------+----------+--------------+ SFJ  Full                                                        +---------+---------------+---------+-----------+----------+--------------+ FV Prox  Full                                                        +---------+---------------+---------+-----------+----------+--------------+ FV Mid   Full                                                        +---------+---------------+---------+-----------+----------+--------------+ FV DistalFull                                                        +---------+---------------+---------+-----------+----------+--------------+ PFV      Full                                                         +---------+---------------+---------+-----------+----------+--------------+ POP      Full           Yes      Yes                                 +---------+---------------+---------+-----------+----------+--------------+ PTV      Full                                                        +---------+---------------+---------+-----------+----------+--------------+ PERO     Partial                                      Acute          +---------+---------------+---------+-----------+----------+--------------+ Gastroc  None                                         Acute          +---------+---------------+---------+-----------+----------+--------------+   +---------+---------------+---------+-----------+----------+--------------+ LEFT     CompressibilityPhasicitySpontaneityPropertiesThrombus Aging +---------+---------------+---------+-----------+----------+--------------+ CFV      Full           Yes      Yes                                 +---------+---------------+---------+-----------+----------+--------------+ SFJ  Full                                                        +---------+---------------+---------+-----------+----------+--------------+ FV Prox  Full                                                        +---------+---------------+---------+-----------+----------+--------------+ FV Mid   Full                                                        +---------+---------------+---------+-----------+----------+--------------+ FV DistalFull                                                        +---------+---------------+---------+-----------+----------+--------------+ PFV      Full                                                        +---------+---------------+---------+-----------+----------+--------------+ POP      Full           Yes      Yes                                  +---------+---------------+---------+-----------+----------+--------------+ PTV      Full                                                        +---------+---------------+---------+-----------+----------+--------------+ PERO     Full                                                        +---------+---------------+---------+-----------+----------+--------------+     Summary: RIGHT: - Findings consistent with acute deep vein thrombosis involving the right peroneal veins, and right gastrocnemius veins. - No cystic structure found in the popliteal fossa.  LEFT: - There is no evidence of deep vein thrombosis in the lower extremity.  *See table(s) above for measurements and observations. Electronically signed by Ruta Hinds MD on 11/24/2019 at 4:06:31 PM.    Final

## 2019-11-26 NOTE — Progress Notes (Signed)
ANTICOAGULATION CONSULT NOTE - Initial Consult  Pharmacy Consult for heparin Indication: DVT  No Active Allergies  Patient Measurements: Height: 5\' 7"  (170.2 cm) Weight: 159 lb 9.8 oz (72.4 kg) IBW/kg (Calculated) : 66.1 Heparin Dosing Weight: n/a. Use TBW 72 kg  Vital Signs: Temp: 97.7 F (36.5 C) (03/26 0805) Temp Source: Axillary (03/26 0805) BP: 143/44 (03/26 0800) Pulse Rate: 107 (03/26 0800)  Labs: Recent Labs    11/24/19 0412 11/24/19 1014 11/24/19 1324 11/24/19 1432 11/24/19 2000 11/25/19 0050 11/25/19 0436 11/25/19 0445 11/26/19 0501  HGB  --   --   --   --   --   --  11.0*  --  11.2*  HCT  --   --   --   --   --   --  35.9*  --  36.5*  PLT  --   --   --   --   --   --  168  --  185  APTT  --   --   --    < > 60* 61*  --   --  66*  LABPROT  --   --  14.3  --   --   --   --   --   --   INR  --   --  1.1  --   --   --   --   --   --   CREATININE 1.46*  --   --   --   --   --  1.56*  --  1.42*  CKTOTAL  --   --   --   --   --   --   --  101  --   CKMB  --   --   --   --   --   --   --  2.9  --   TROPONINIHS  --  6,886*  --   --   --   --   --  5,180*  --    < > = values in this interval not displayed.    Estimated Creatinine Clearance: 40.7 mL/min (A) (by C-G formula based on SCr of 1.42 mg/dL (H)).   Medical History: Past Medical History:  Diagnosis Date  . Aortic insufficiency   . Diabetes mellitus ([place])   . Dyslipidemia   . Essential hypertension   . 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 bacteremia 03/2019 with strep mitis.  . Severe aortic stenosis    Assessment: Pt admitted to [facility] on 3/20 as a transfer from [facility] where he was admitted for PNA/respiratory failure. Initially on enoxaparin 30 mg subQ daily for VTE ppx.  On 3/24, Plt dropped to 142 (initially 382 on 3/20); venous dopplar on 3/24 + right VTE. Argatroban initiated at that time for r/o HIT. HIT antibody is negative, anticoagulation being transitioned to heparin  drip per pharmacy today for VTE treatment.   Significant Events: -3/24: Venous dopplar + right DVT -3/24: Argatroban initiated for r/o HIT with Plt drip and +VTE -3/26: Transition AC to IV heparin as HIT antibody negative (0.237)  Today, 11/26/19  CBC: Hgb low but stable; Plt WNL and increasing  Transitioning anticoagulation from argatroban to IV Heparin  Goal of Therapy:  Heparin level 0.3-0.7 units/ml Monitor platelets by anticoagulation protocol: Yes   Plan:   Discontinue argatroban. Remove heparin allergy from chart as HIT antibody negative  Initiate heparin drip at 1200 units/hr. No initial bolus  Check HL in 6 hours  HL, CBC daily while on heparin   Monitor for signs/symptoms of bleeding  Lenis Noon, PharmD 11/26/2019,9:40 AM

## 2019-11-26 NOTE — Progress Notes (Signed)
Brief cardiology progress note: See note from yesterday. No changes to recommendations. We will follow peripherally but please call with any questions or concerns. Note that he follows with Dr. Otho Perl as an outpatient historically, but heartcare is happy to assist while admitted.  We will sign off for daily evaluation but are happy to reassess with a change in clinical status or any new questions/concerns.  Jeffrey Dresser, MD, PhD [facility]  [address], [place], [place] [address] [phone]

## 2019-11-27 ENCOUNTER — Inpatient Hospital Stay (HOSPITAL_COMMUNITY): Payer: Medicare HMO

## 2019-11-27 ENCOUNTER — Inpatient Hospital Stay: Payer: Self-pay

## 2019-11-27 LAB — BASIC METABOLIC PANEL
Anion gap: 10 (ref 5–15)
BUN: 73 mg/dL — ABNORMAL HIGH (ref 8–23)
CO2: 32 mmol/L (ref 22–32)
Calcium: 7.9 mg/dL — ABNORMAL LOW (ref 8.9–10.3)
Chloride: 111 mmol/L (ref 98–111)
Creatinine, Ser: 1.75 mg/dL — ABNORMAL HIGH (ref 0.61–1.24)
GFR calc Af Amer: 43 mL/min — ABNORMAL LOW (ref >60)
GFR calc non Af Amer: 37 mL/min — ABNORMAL LOW (ref >60)
Glucose, Bld: 219 mg/dL — ABNORMAL HIGH (ref 70–99)
Potassium: 3.8 mmol/L (ref 3.5–5.1)
Sodium: 153 mmol/L — ABNORMAL HIGH (ref 135–145)

## 2019-11-27 LAB — CBC
HCT: 39.1 % (ref 39.0–52.0)
Hemoglobin: 11.7 g/dL — ABNORMAL LOW (ref 13.0–17.0)
MCH: 28.7 pg (ref 26.0–34.0)
MCHC: 29.9 g/dL — ABNORMAL LOW (ref 30.0–36.0)
MCV: 95.8 fL (ref 80.0–100.0)
Platelets: 236 10*3/uL (ref 150–400)
RBC: 4.08 MIL/uL — ABNORMAL LOW (ref 4.22–5.81)
RDW: 13.5 % (ref 11.5–15.5)
WBC: 24.6 10*3/uL — ABNORMAL HIGH (ref 4.0–10.5)
nRBC: 0.1 % (ref 0.0–0.2)

## 2019-11-27 LAB — MAGNESIUM: Magnesium: 2.5 mg/dL — ABNORMAL HIGH (ref 1.7–2.4)

## 2019-11-27 LAB — GLUCOSE, CAPILLARY
Glucose-Capillary: 217 mg/dL — ABNORMAL HIGH (ref 70–99)
Glucose-Capillary: 195 mg/dL — ABNORMAL HIGH (ref 70–99)
Glucose-Capillary: 196 mg/dL — ABNORMAL HIGH (ref 70–99)
Glucose-Capillary: 209 mg/dL — ABNORMAL HIGH (ref 70–99)
Glucose-Capillary: 237 mg/dL — ABNORMAL HIGH (ref 70–99)
Glucose-Capillary: 165 mg/dL — ABNORMAL HIGH (ref 70–99)

## 2019-11-27 LAB — HEPARIN LEVEL (UNFRACTIONATED)
Heparin Unfractionated: 0.42 IU/mL (ref 0.30–0.70)
Heparin Unfractionated: 0.37 IU/mL (ref 0.30–0.70)

## 2019-11-27 LAB — PHOSPHORUS: Phosphorus: 4.2 mg/dL (ref 2.5–4.6)

## 2019-11-27 LAB — TRIGLYCERIDES: Triglycerides: 95 mg/dL (ref <150)

## 2019-11-27 MED ORDER — METOPROLOL TARTRATE 5 MG/5ML IV SOLN: 2.5000 mg | INTRAVENOUS | Status: DC | PRN

## 2019-11-27 MED ORDER — METOPROLOL TARTRATE 5 MG/5ML IV SOLN
INTRAVENOUS | Status: AC
  Administered 2019-11-27: 3 mg
  Filled 2019-11-27: qty 5

## 2019-11-27 NOTE — Progress Notes (Addendum)
NAME:  Garrie Woodin, MRN:  [id number], DOB:  12/30/1940, LOS: 7 ADMISSION DATE:  11/10/2019, CONSULTATION DATE:  3/20 REFERRING MD:  [facility], CHIEF COMPLAINT:  Dyspnea   Brief History   79 y/o male admitted on 3/20 from [facility] where he was admitted for pneumonia causing severe acute respiratory failure with hypoxemia on 3/15.    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   has no past surgical history on file.   Significant Hospital Events   3/15 admission [facility]  3/20 admission [facility], intubation, prone position  3/23- Heavily sedated. FiO2 needs down  11/24/2019:-Known supine ventilation with 40% FiO2.  On propofol and Dilaudid infusions.  On wake up assessment and spontaneous breathing trial quickly became tachypneic and failed attempted spontaneous breathing trial.  Not on pressors.  Making urine.  Improved Creat. White count coming down.  11/25/19 - Air.  Traps and has abdominal chest wall paradoxus when sedation decreased.  Cardiology consult recommend scheduled Lopressor.  Possible TEE recommended just prior to extubation to evaluate aortic valve for stenosis.  Outpatient cardiac cath recommended.   3/26 =- febrile.  Last fever 2 days ago.  On 40% oxygen and 8 of PEEP.  Currently on pressure support 10/5 but well sedated with Dilaudid and propofol.  Pulse ox 89-90%.  Made good urine with Lasix yesterday.  Blood pressure adequate.  Not on pressors.  On Aargotroban  for DVT -> HITT negative -> changed to IV heparin   Consults:  none  Procedures:  3/20 ETT >  3/20 L IJ CVL >   Significant Diagnostic Tests:  3/15 CT angiogram chest OSH> no PE, marked severity diffuse bilateral partially ground-glass appearing infiltrates. Small bilateral pleural  effusions, R > L. Received his second COVID vaccine 3 weeks prior to admission.  3/16 TTE > Mild to moderate aortic regurgitation, severe aortic stenosis, peak gradient 25mHg, mild LVH, LVEF 55-60%, LA dilated  Micro Data:  3/15 SARS COV 2 > neg 3/18 SARS COV 2 > neg 3/15 blood culture > neg 3/20 sputum culture > normal flora 3/20 urine strep >  neg 3/20 urine legionella >  3/20 BAL >  Normal flor 3/20 BAL fungus >  3/20 BAL AFB > neg so far 3/20 blood > no growth 3/21 RVP> rhinovirus  Antimicrobials:  3/20 vanc >  3/24 3/20 cefepime > 3/27   Interim history/subjective:   11/27/2019 - wbc rising, Fever down. On 50% fio2, peep 8. Failed SBT with tachypnea even before WUA completed. On dilaudid and diprivan gtt. Wife at bedside. Not on pressors. Tachcardic and lopressor increased yesterday   Objective   Blood pressure (!) 148/65, pulse (!) 124, temperature 98.3 F (36.8 C), temperature source Axillary, resp. rate 19, height _0  (1.702 m), weight 71.9 kg, SpO2 94 %. CVP:  [1 mmHg-4 mmHg] 1 mmHg  Vent Mode: PRVC FiO2 (%):  [40 %-60 %] 40 % Set Rate:  [18 bmp] 18 bmp Vt Set:  [450 mL] 450 mL PEEP:  [8 cmH20] 8 cmH20 Pressure Support:  [10 cmH20] 10 cmH20 Plateau Pressure:  [21 cmH20-23 cmH20] 22 cmH20   Intake/Output Summary (Last 24 hours) at 11/27/2019 1029 Last data filed at 11/27/2019 1000 Gross per 24 hour  Intake 2053.05 ml  Output 3350 ml  Net -1296.95 ml   FAutoliv  11/25/19 0500 11/26/19 0500 11/27/19 0500  Weight: 73.9 kg 72.4 kg 71.9 kg   Vent Mode: PRVC FiO2 (%):  [40 %-60 %] 40 % Set Rate:  [18 bmp] 18 bmp Vt Set:  [450 mL] 450 mL PEEP:  [8 cmH20] 8 cmH20 Pressure Support:  [10 cmH20] 10 cmH20 Plateau Pressure:  [21 cmH20-23 cmH20] 22 cmH20  Examination:  General Appearance:  Looks criticall ill Head:  Normocephalic, without obvious abnormality, atraumatic Eyes:  PERRL - yes, conjunctiva/corneas - muddy     Ears:  Normal external ear  canals, both ears Nose:  G tube - no Throat:  ETT TUBE - yde , OG tube - yes Neck:  Supple,  No enlargement/tenderness/nodules Lungs: Clear to auscultation bilaterally, Ventilator   Synchrony - yes but tachypneic and gets pradoxical if not well sedated Heart:  S1 and S2 normal, no murmur, CVP - no.  Pressors - no Abdomen:  Soft, no masses, no organomegaly Genitalia / Rectal:  Not done Extremities:  Extremities- intact Skin:  ntact in exposed areas . Sacral area - not examined Neurologic:  Sedation - dilaudid gtt, diprivan gtt -> RASS - -4       Resolved Hospital Problem list   Septic shock> resolved Thromoboctyopenia - new on 3/24 142->168  Assessment & Plan:  Acute resp failure and ARDS from Rhinovirus pneumonia:   11/27/2019  -failed SBT. Gets dysnchronous easily on even slight sedation wean  plan ARDS protocol - ok to for RR 18/m.  No extubation  Aim for tracheostomy - though < 14 days - all indicators are he needs it next few to several days -  -  educated wife on trach and indications, risks, limitations and benefits. Showed picture. Introduced the operator Dr Ina Homes by name and Dr Carlis Abbott by name. Showed her pic of trach. She is willing. Check INR  Need for sedation: still very agitated, severe ventilator dyssynchrony  on  Dilaudid and propofol  11/27/2019 - RASS -4  Gets dysnchronous otherwise  Plan RASS goal 0 to -3 with vent sync - if w ecan Continue dilaudid and diprivan  - check CK and lacate  AKI and volume loverlad  11/27/2019 - stabilized at creat 1.42 on 3/26 but today up to 1.7 in setting of lasix. Now even balance since admit. But also hypernatermic  plan Continue to monitor Hold off any more lasix Holdd off free water for the moment  HCAP?   11/27/2019 - no major fever x snce 11/24/2019  plan  cefepime and complete 7 days  11/27/19    Hx of Hypertension and Severe aortic stenosis Severe AS by 2D echo EF 55%    -3/27 -  Clinically stable  but gets tachy. On lopressor per cards since 3/24 and increasd 3/26  Plan Possible TEE recommended prior to extubation Cath recommended as opd Asa 81 Add lopressor prn   Anemia of critical illness  11/27/2019 - no bleeding. Stable anemia  Plan  - - PRBC for hgb </= 6.9gm%    - exceptions are   -  if ACS susepcted/confirmed then transfuse for hgb </= 8.0gm%,  or    -  active bleeding with hemodynamic instability, then transfuse regardless of hemoglobin value   At at all times try to transfuse 1 unit prbc as possible with exception of active hemorrhage     Duplex study reveals acute deep vein thrombosis involving the right peroneal and gastrocnemius veins - HITT negaive  11/27/2019 - no bleeding.On IV heparin.  Off [place] IV heparin gtt wth pharmacy monitoring   DM2 CBG (last 3)  Recent Labs    11/27/19 0332 11/27/19 0741 11/27/19 0818  GLUCAP 217* 195* 196*    Continue sliding scale insulin protocol    Best practice:  Diet:  TF Pain/Anxiety/Delirium protocol (if indicated): diprivan and dilaudid VAP protocol (if indicated): nHOB ? 30 DVT prophylaxis: IV heparin Rx for DVT (stopped argatroban 3/26) GI prophylaxis: protonix Glucose control: ssi Mobility: bed rest Code Status: full Family Communication: 11/25/2019 family updated at bedside. Also 3/26 and wife Cleo at bedside 11/27/19  Disposition: remain in [place]   The patient Jeffrey Obrien is critically ill with multiple organ systems failure and requires high complexity decision making for assessment and support, frequent evaluation and titration of therapies, application of advanced monitoring technologies and extensive interpretation of multiple databases.   Critical Care Time devoted to patient care services described in this note is  31  Minutes. This time reflects time of care of this signee Dr Brand Males. This critical care time does not reflect procedure time,  or teaching time or supervisory time of PA/NP/Med student/Med Resident etc but could involve care discussion time     Dr. Brand Males, M.D., [facility].C.P Pulmonary and Critical Care Medicine Staff Physician [place] [facility] Pager: [phone], If no answer or between  15:00h - 7:00h: call [phone]  11/27/2019 10:29 AM      LABS    PULMONARY Recent Labs  Lab 12/01/2019 1547 11/12/2019 2200 11/21/19 1400 11/22/19 0300  PHART 7.179* 7.330* 7.273* 7.327*  PCO2ART 71.9* 46.4 52.8* 47.5  PO2ART 91.6 214* 120* 93.3  HCO3 25.7 23.9 23.6 24.2  O2SAT 92.9 99.1 98.7 97.1    CBC Recent Labs  Lab 11/25/19 0436 11/26/19 0501 11/27/19 0500  HGB 11.0* 11.2* 11.7*  HCT 35.9* 36.5* 39.1  WBC 17.3* 17.4* 24.6*  PLT 168 185 236    COAGULATION Recent Labs  Lab 11/24/19 1324  INR 1.1    CARDIAC  No results for input(s): TROPONINI in the last 168 hours. No results for input(s): PROBNP in the last 168 hours.   CHEMISTRY Recent Labs  Lab 11/21/19 1720 11/21/19 1834 11/22/19 0300 11/23/19 0355 11/23/19 0355 11/24/19 0412 11/24/19 0412 11/25/19 0436 11/25/19 0436 11/26/19 0501 11/27/19 0500  NA  --    < >  --  142  --  144  --  146*  --  148* 153*  K  --    < >  --  3.9   < > 3.9   < > 4.2   < > 4.0 3.8  CL  --    < >  --  108  --  111  --  108  --  108 111  CO2  --    < >  --  26  --  26  --  31  --  33* 32  GLUCOSE  --    < >  --  181*  --  150*  --  158*  --  200* 219*  BUN  --    < >  --  70*  --  59*  --  57*  --  60* 73*  CREATININE  --    < >  --  2.01*  --  1.46*  --  1.56*  --  1.42* 1.75*  CALCIUM  --    < >  --  7.8*  --  7.9*  --  8.1*  --  8.1* 7.9*  MG 2.6*  --  2.7*  --   --  2.4  --   --   --  2.5* 2.5*  PHOS 7.8*  --  5.9*  --   --  3.3  --   --   --  3.2 4.2   < > = values in this interval not displayed.   Estimated Creatinine Clearance: 33.1 mL/min (A) (by C-G formula based on SCr of 1.75 mg/dL  (H)).   LIVER Recent Labs  Lab 11/10/2019 1416 11/23/19 0355 11/24/19 1324 11/25/19 0436  AST 35 32  --  23  ALT 51* 32  --  22  ALKPHOS 171* 71  --  61  BILITOT 1.6* 0.6  --  0.7  PROT 7.2 5.3*  --  5.3*  ALBUMIN 3.2* 2.2*  --  2.1*  INR  --   --  1.1  --      INFECTIOUS Recent Labs  Lab 11/24/19 1014 11/25/19 0436 11/25/19 0445 11/26/19 0501  LATICACIDVEN  --   --  0.9  --   PROCALCITON 0.10 0.18  --  0.42     ENDOCRINE CBG (last 3)  Recent Labs    11/27/19 0332 11/27/19 0741 11/27/19 0818  GLUCAP 217* 195* 196*         IMAGING x48h  - image(s) personally visualized  -   highlighted in bold DG Chest Port 1 View  Result Date: 11/27/2019 CLINICAL DATA:  Respiratory failure EXAM: PORTABLE CHEST 1 VIEW COMPARISON:  11/26/2019 FINDINGS: Endotracheal tube with the tip 3.6 cm above the carina. Nasogastric tube coursing below the diaphragm. Left jugular central venous catheter in satisfactory unchanged position. Bilateral diffuse interstitial and patchy alveolar airspace opacities. No pleural effusion or pneumothorax. Stable cardiomediastinal silhouette. No aggressive osseous lesion. IMPRESSION: 1. Support lines and tubing in satisfactory position. 2. Persistent bilateral interstitial and alveolar airspace opacities as can be seen with multilobar pneumonia or ARDS. Electronically Signed   By: Kathreen Devoid   On: 11/27/2019 06:38   DG Chest Port 1 View  Result Date: 11/26/2019 CLINICAL DATA:  Respiratory failure EXAM: PORTABLE CHEST 1 VIEW COMPARISON:  None. FINDINGS: The heart size and mediastinal contours are stable. ETT is 2 cm above the carina. NG tube is seen below the diaphragm. A left-sided central venous catheter seen at the brachiocephalic SVC junction. There is slight interval worsening in the multifocal patchy airspace opacities throughout both lungs. No pneumothorax. No acute osseous abnormality. IMPRESSION: Interval slight worsening in the multifocal airspace  opacities Electronically Signed   By: Prudencio Pair M.D.   On: 11/26/2019 05:26   DG CHEST PORT 1 VIEW  Result Date: 11/25/2019 CLINICAL DATA:  ARDS. Rule out pneumomediastinum. EXAM: PORTABLE CHEST 1 VIEW COMPARISON:  11/25/2019 FINDINGS: An endotracheal tube terminates at the inferior margin of the clavicular heads. A left jugular catheter terminates over the upper SVC. The enteric tube courses into the abdomen with tip not imaged. The cardiomediastinal silhouette is unchanged. Widespread airspace opacities are again seen in the right greater than left lungs with mildly improved aeration bilaterally, particularly in the right midlung. No sizable pleural effusion, pneumothorax, or definite pneumomediastinum is identified. IMPRESSION: Mildly improved aeration of the lungs. No evidence of pneumomediastinum. Electronically Signed   By: Logan Bores M.D.   On: 11/25/2019 17:12

## 2019-11-27 NOTE — Progress Notes (Signed)
[place] for heparin Indication: DVT   Briefly, Pt admitted on 3/20 and currently on Heparin IV for VTE treatment (see full note from earlier today).  He was changed from Argatroban to Heparin earlier today since HIT Ab and SRA are negative.   Heparin level now therapeutic on heparin infusion rate at 1400 units/hr.  No bleeding or complications reported.  Goal of Therapy:  Heparin level 0.3-0.7 units/ml Monitor platelets by anticoagulation protocol: Yes   Plan:  Continue current IV heparin rate of 1400 units/hr Recheck heparin level at 1000 Daily CBC   Adrian Saran, PharmD, BCPS 11/27/2019 3:52 AM

## 2019-11-27 NOTE — Progress Notes (Signed)
ANTICOAGULATION CONSULT NOTE - Initial Consult  Pharmacy Consult for heparin Indication: DVT  No Active Allergies  Patient Measurements: Height: 5\' 7"  (170.2 cm) Weight: 158 lb 8.2 oz (71.9 kg) IBW/kg (Calculated) : 66.1 Heparin Dosing Weight: n/a. Use TBW 72 kg  Vital Signs: Temp: 98.3 F (36.8 C) (03/27 0800) Temp Source: Axillary (03/27 0800) BP: 148/65 (03/27 1000) Pulse Rate: 124 (03/27 1000)  Labs: Recent Labs    11/24/19 1324 11/24/19 1432 11/24/19 2000 11/25/19 0050 11/25/19 0436 11/25/19 0436 11/25/19 0445 11/26/19 0501 11/26/19 1630 11/27/19 0200 11/27/19 0500 11/27/19 1000  HGB  --   --   --   --  11.0*   < >  --  11.2*  --   --  11.7*  --   HCT  --   --   --   --  35.9*  --   --  36.5*  --   --  39.1  --   PLT  --   --   --   --  168  --   --  185  --   --  236  --   APTT  --    < > 60* 61*  --   --   --  66*  --   --   --   --   LABPROT 14.3  --   --   --   --   --   --   --   --   --   --   --   INR 1.1  --   --   --   --   --   --   --   --   --   --   --   HEPARINUNFRC  --   --   --   --   --   --   --   --  0.12* 0.42  --  0.37  CREATININE  --   --   --   --  1.56*  --   --  1.42*  --   --  1.75*  --   CKTOTAL  --   --   --   --   --   --  101  --   --   --   --   --   CKMB  --   --   --   --   --   --  2.9  --   --   --   --   --   TROPONINIHS  --   --   --   --   --   --  5,180*  --   --   --   --   --    < > = values in this interval not displayed.    Estimated Creatinine Clearance: 33.1 mL/min (A) (by C-G formula based on SCr of 1.75 mg/dL (H)).   Medical History: Past Medical History:  Diagnosis Date  . Aortic insufficiency   . Diabetes mellitus ([place])   . Dyslipidemia   . Essential hypertension   . 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 bacteremia 03/2019 with strep mitis.  . Severe aortic stenosis    Assessment: Pt admitted to [facility] on 3/20 as a transfer from [facility] where he was admitted for PNA/respiratory  failure. Initially on enoxaparin 30 mg subQ daily for VTE ppx.  On 3/24, Plt dropped to 142 (initially 382 on 3/20);  venous dopplar on 3/24 + right VTE. Argatroban initiated at that time for r/o HIT. HIT antibody negative, anticoagulation transitioned to heparin drip per pharmacy on 3/26 for VTE treatment.   Significant Events: -3/24: Venous dopplar + right DVT -3/24: Argatroban initiated for r/o HIT with Plt drip and +VTE -3/26: Transition AC to IV heparin as HIT antibody negative (0.237), SRA negative  Today, 11/27/19  CBC: Hgb low but stable; Plt WNL and increasing  Confirmatory HL = 0.37 remains therapeutic on heparin infusion of 1400 units/hr  Confirmed with RN that heparin infusing at correct rate. No signs of bleeding  SCr 1.75, CrCl 33 mL/min. Worsening renal function  Goal of Therapy:  Heparin level 0.3-0.7 units/ml Monitor platelets by anticoagulation protocol: Yes   Plan:   Continue heparin infusion at current rate of 1400 units/hr  HL, CBC daily while on heparin   Monitor for signs/symptoms of bleeding  If patient to get trach, MD to provide guidance on when to hold heparin prior to procedure.   Lenis Noon, PharmD 11/27/2019,11:06 AM

## 2019-11-28 ENCOUNTER — Inpatient Hospital Stay (HOSPITAL_COMMUNITY): Payer: Medicare HMO

## 2019-11-28 DIAGNOSIS — U071 COVID-19: Secondary | ICD-10-CM

## 2019-11-28 DIAGNOSIS — E870 Hyperosmolality and hypernatremia: Secondary | ICD-10-CM

## 2019-11-28 DIAGNOSIS — J80 Acute respiratory distress syndrome: Secondary | ICD-10-CM

## 2019-11-28 DIAGNOSIS — J189 Pneumonia, unspecified organism: Secondary | ICD-10-CM

## 2019-11-28 DIAGNOSIS — R739 Hyperglycemia, unspecified: Secondary | ICD-10-CM

## 2019-11-28 LAB — GLUCOSE, CAPILLARY
Glucose-Capillary: 124 mg/dL — ABNORMAL HIGH (ref 70–99)
Glucose-Capillary: 132 mg/dL — ABNORMAL HIGH (ref 70–99)
Glucose-Capillary: 159 mg/dL — ABNORMAL HIGH (ref 70–99)
Glucose-Capillary: 176 mg/dL — ABNORMAL HIGH (ref 70–99)
Glucose-Capillary: 176 mg/dL — ABNORMAL HIGH (ref 70–99)
Glucose-Capillary: 183 mg/dL — ABNORMAL HIGH (ref 70–99)
Glucose-Capillary: 190 mg/dL — ABNORMAL HIGH (ref 70–99)
Glucose-Capillary: 198 mg/dL — ABNORMAL HIGH (ref 70–99)

## 2019-11-28 LAB — URINALYSIS, ROUTINE W REFLEX MICROSCOPIC
Bacteria, UA: NONE SEEN
Bilirubin Urine: NEGATIVE
Glucose, UA: 50 mg/dL — AB
Ketones, ur: NEGATIVE mg/dL
Leukocytes,Ua: NEGATIVE
Nitrite: NEGATIVE
Protein, ur: 100 mg/dL — AB
Specific Gravity, Urine: 1.017 (ref 1.005–1.030)
pH: 5 (ref 5.0–8.0)

## 2019-11-28 LAB — COMPREHENSIVE METABOLIC PANEL
ALT: 22 U/L (ref 0–44)
AST: 32 U/L (ref 15–41)
Albumin: 1.8 g/dL — ABNORMAL LOW (ref 3.5–5.0)
Alkaline Phosphatase: 79 U/L (ref 38–126)
Anion gap: 7 (ref 5–15)
BUN: 86 mg/dL — ABNORMAL HIGH (ref 8–23)
CO2: 36 mmol/L — ABNORMAL HIGH (ref 22–32)
Calcium: 8.3 mg/dL — ABNORMAL LOW (ref 8.9–10.3)
Chloride: 116 mmol/L — ABNORMAL HIGH (ref 98–111)
Creatinine, Ser: 1.95 mg/dL — ABNORMAL HIGH (ref 0.61–1.24)
GFR calc Af Amer: 37 mL/min — ABNORMAL LOW (ref >60)
GFR calc non Af Amer: 32 mL/min — ABNORMAL LOW (ref >60)
Glucose, Bld: 207 mg/dL — ABNORMAL HIGH (ref 70–99)
Potassium: 3.9 mmol/L (ref 3.5–5.1)
Sodium: 159 mmol/L — ABNORMAL HIGH (ref 135–145)
Total Bilirubin: 0.5 mg/dL (ref 0.3–1.2)
Total Protein: 5.6 g/dL — ABNORMAL LOW (ref 6.5–8.1)

## 2019-11-28 LAB — CBC
HCT: 35.8 % — ABNORMAL LOW (ref 39.0–52.0)
Hemoglobin: 10.5 g/dL — ABNORMAL LOW (ref 13.0–17.0)
MCH: 28.7 pg (ref 26.0–34.0)
MCHC: 29.3 g/dL — ABNORMAL LOW (ref 30.0–36.0)
MCV: 97.8 fL (ref 80.0–100.0)
Platelets: 203 10*3/uL (ref 150–400)
RBC: 3.66 MIL/uL — ABNORMAL LOW (ref 4.22–5.81)
RDW: 13.6 % (ref 11.5–15.5)
WBC: 20.1 10*3/uL — ABNORMAL HIGH (ref 4.0–10.5)
nRBC: 0 % (ref 0.0–0.2)

## 2019-11-28 LAB — BASIC METABOLIC PANEL
Anion gap: 10 (ref 5–15)
Anion gap: 8 (ref 5–15)
BUN: 88 mg/dL — ABNORMAL HIGH (ref 8–23)
BUN: 93 mg/dL — ABNORMAL HIGH (ref 8–23)
CO2: 33 mmol/L — ABNORMAL HIGH (ref 22–32)
CO2: 36 mmol/L — ABNORMAL HIGH (ref 22–32)
Calcium: 8.2 mg/dL — ABNORMAL LOW (ref 8.9–10.3)
Calcium: 8.2 mg/dL — ABNORMAL LOW (ref 8.9–10.3)
Chloride: 111 mmol/L (ref 98–111)
Chloride: 111 mmol/L (ref 98–111)
Creatinine, Ser: 1.97 mg/dL — ABNORMAL HIGH (ref 0.61–1.24)
Creatinine, Ser: 2.08 mg/dL — ABNORMAL HIGH (ref 0.61–1.24)
GFR calc Af Amer: 35 mL/min — ABNORMAL LOW (ref >60)
GFR calc Af Amer: 37 mL/min — ABNORMAL LOW (ref >60)
GFR calc non Af Amer: 30 mL/min — ABNORMAL LOW (ref >60)
GFR calc non Af Amer: 32 mL/min — ABNORMAL LOW (ref >60)
Glucose, Bld: 144 mg/dL — ABNORMAL HIGH (ref 70–99)
Glucose, Bld: 175 mg/dL — ABNORMAL HIGH (ref 70–99)
Potassium: 4.1 mmol/L (ref 3.5–5.1)
Potassium: 4.2 mmol/L (ref 3.5–5.1)
Sodium: 154 mmol/L — ABNORMAL HIGH (ref 135–145)
Sodium: 155 mmol/L — ABNORMAL HIGH (ref 135–145)

## 2019-11-28 LAB — CK TOTAL AND CKMB (NOT AT ARMC)
CK, MB: 3.7 ng/mL (ref 0.5–5.0)
Relative Index: INVALID (ref 0.0–2.5)
Total CK: 30 U/L — ABNORMAL LOW (ref 49–397)

## 2019-11-28 LAB — PROCALCITONIN: Procalcitonin: 0.52 ng/mL

## 2019-11-28 LAB — LACTIC ACID, PLASMA: Lactic Acid, Venous: 1 mmol/L (ref 0.5–1.9)

## 2019-11-28 LAB — PROTIME-INR
INR: 1.3 — ABNORMAL HIGH (ref 0.8–1.2)
Prothrombin Time: 16.1 seconds — ABNORMAL HIGH (ref 11.4–15.2)

## 2019-11-28 LAB — TRIGLYCERIDES: Triglycerides: 91 mg/dL (ref <150)

## 2019-11-28 LAB — TROPONIN I (HIGH SENSITIVITY): Troponin I (High Sensitivity): 1634 ng/L (ref <18)

## 2019-11-28 LAB — HEPARIN LEVEL (UNFRACTIONATED): Heparin Unfractionated: 0.46 IU/mL (ref 0.30–0.70)

## 2019-11-28 MED ORDER — SODIUM CHLORIDE 0.9% FLUSH: 10.0000 mL | INTRAVENOUS | Status: DC | PRN

## 2019-11-28 MED ORDER — SODIUM CHLORIDE 0.9% FLUSH
10.0000 mL | Freq: Two times a day (BID) | INTRAVENOUS | Status: DC
  Administered 2019-11-28 (×2): 10 mL

## 2019-11-28 MED ORDER — INSULIN ASPART 100 UNIT/ML ~~LOC~~ SOLN
5.0000 [IU] | SUBCUTANEOUS | Status: DC
  Administered 2019-11-28 – 2019-11-29 (×7): 5 [IU] via SUBCUTANEOUS

## 2019-11-28 MED ORDER — FREE WATER
200.0000 mL | Status: DC
  Administered 2019-11-28 – 2019-11-30 (×27): 200 mL

## 2019-11-28 MED ORDER — INSULIN DETEMIR 100 UNIT/ML ~~LOC~~ SOLN
20.0000 [IU] | Freq: Every day | SUBCUTANEOUS | Status: DC
  Administered 2019-11-28 – 2019-11-30 (×3): 20 [IU] via SUBCUTANEOUS
  Filled 2019-11-28 (×3): qty 0.2

## 2019-11-28 NOTE — Progress Notes (Signed)
eLink Physician-Brief Progress Note Patient Name: Jeffrey Obrien DOB: 07-19-1941 MRN: [id number]   Date of Service  11/28/2019  HPI/Events of Note  Hypernatremia  eICU Interventions  Free water 200 ml via NG tube Q 2 hours ordered.        [name] Sherill Wegener 11/28/2019, 6:06 AM

## 2019-11-28 NOTE — Progress Notes (Addendum)
eLink Physician-Brief Progress Note Patient Name: Jeffrey Obrien DOB: 04-30-1941 MRN: [id number]   Date of Service  11/28/2019  HPI/Events of Note  Sign out mentions to follow up hypernatremia while on 200 cc free water q2h   eICU Interventions  Discussed with RN, sample in lab and not resulted yet. RN will call E link once lab results to check if any titration needed     Intervention Category Major Interventions: Electrolyte abnormality - evaluation and management  Margaretmary Lombard 11/28/2019, 7:47 PM   Addendum Repeat sodium is 155, acceptable Need serial follow up to make sure correction is not over 6-8 meq in 24 hours Will get another one at midnight Also wrote for labs in AM - BMP at 5 am  Requested RN call us with results to see if titration needed

## 2019-11-28 NOTE — Progress Notes (Signed)
Collected ordered sputum sample and sent to Lab for analysis- RN aware.

## 2019-11-28 NOTE — Progress Notes (Signed)
Peripherally Inserted Central Catheter Placement  The IV Nurse has discussed with the patient and/or persons authorized to consent for the patient, the purpose of this procedure and the potential benefits and risks involved with this procedure.  The benefits include less needle sticks, lab draws from the catheter, and the patient may be discharged home with the catheter. Risks include, but not limited to, infection, bleeding, blood clot (thrombus formation), and puncture of an artery; nerve damage and irregular heartbeat and possibility to perform a PICC exchange if needed/ordered by physician.  Alternatives to this procedure were also discussed.  Bard Power PICC patient education guide, fact sheet on infection prevention and patient information card has been provided to patient /or left at bedside. Telephone consent obtained from [facility], patient's wife.    PICC Placement Documentation  PICC Double Lumen [id number] PICC Left Basilic 44 cm 0 cm (Active)  Indication for Insertion or Continuance of Line Prolonged intravenous therapies 11/28/19 0900  Exposed Catheter (cm) 0 cm 11/28/19 0900  Site Assessment Clean;Dry;Intact 11/28/19 0900  Lumen #1 Status Flushed;Blood return noted;Saline locked 11/28/19 0900  Lumen #2 Status Flushed;Blood return noted;Saline locked 11/28/19 0900  Dressing Type Transparent 11/28/19 0900  Dressing Status Clean;Dry;Intact;Antimicrobial disc in place 11/28/19 0900  Line Care Connections checked and tightened 11/28/19 0900  Line Adjustment (NICU/IV Team Only) No 11/28/19 0900  Dressing Intervention New dressing 11/28/19 0900  Dressing Change Due 12/05/19 11/28/19 0900       Lorenza Cambridge 11/28/2019, 9:56 AM

## 2019-11-28 NOTE — Progress Notes (Signed)
NAME:  Jeffrey Obrien, MRN:  [id number], DOB:  [birth year], LOS: 8 ADMISSION DATE:  11/09/2019, CONSULTATION DATE:  3/20 REFERRING MD:  [facility], CHIEF COMPLAINT:  Dyspnea   Brief History   79 y/o male admitted on 3/20 from [facility] where he was admitted for pneumonia causing severe acute respiratory failure with hypoxemia on 3/15.    Past Medical History  Aortic stenosis, severe Hypertension DM2  Significant Hospital Events   3/15 admission [facility]  3/20 admission [facility], intubation, prone position  3/23- Heavily sedated. FiO2 needs down  11/24/2019:-Known supine ventilation with 40% FiO2.  On propofol and Dilaudid infusions.  On wake up assessment and spontaneous breathing trial quickly became tachypneic and failed attempted spontaneous breathing trial.  Not on pressors.  Making urine.  Improved Creat. White count coming down.  11/25/19 - Air.  Traps and has abdominal chest wall paradoxus when sedation decreased.  Cardiology consult recommend scheduled Lopressor.  Possible TEE recommended just prior to extubation to evaluate aortic valve for stenosis.  Outpatient cardiac cath recommended.  3/26 - febrile.  Last fever 2 days ago.  On 40% oxygen and 8 of PEEP.  Currently on pressure support 10/5 but well sedated with Dilaudid and propofol.  Pulse ox 89-90%.  Made good urine with Lasix yesterday.  Blood pressure adequate.  Not on pressors.  On Aargotroban  for DVT -> HITT negative -> changed to IV heparin   Consults:  Cardiology  Procedures:  3/20 ETT >  3/20 L IJ CVL >   Significant Diagnostic Tests:  3/15 CT angiogram chest OSH> no PE, marked severity diffuse bilateral partially ground-glass appearing infiltrates. Small bilateral pleural effusions, R > L. Received his second COVID vaccine 3 weeks prior to admission.  3/16 TTE > Mild to moderate aortic regurgitation, severe aortic stenosis, peak gradient 60mHg, mild LVH, LVEF 55-60%, LA dilated.   3/24 TTE>  severe AS, LVEF 50 to 55%.  No regional wall motion abnormalities.  Moderate LVH of the basal septal segment.  Normal LA, RV, RA.  3/24 duplex-RLE DVT peroneal, gastrocnemius veins  Micro Data:  3/15 SARS COV 2 > neg 3/18 SARS COV 2 > neg 3/15 blood culture > neg 3/20 sputum culture > normal flora 3/20 urine strep >  neg 3/20 urine legionella >  3/20 BAL >  Normal flor 3/20 BAL fungus >  3/20 BAL AFB > neg so far 3/20 blood > no growth 3/21 RVP> rhinovirus  Antimicrobials:  3/20 vanc >  3/24 3/20 cefepime > 3/27   Interim history/subjective:   No acute events overnight  Objective   Blood pressure (!) 120/58, pulse (!) 115, temperature 98.4 F (36.9 C), temperature source Oral, resp. rate 17, height _0  (1.702 m), weight 72.8 kg, SpO2 95 %. CVP:  [0 mmHg-9 mmHg] 9 mmHg  Vent Mode: PRVC FiO2 (%):  [50 %] 50 % Set Rate:  [18 bmp] 18 bmp Vt Set:  [450 mL] 450 mL PEEP:  [5 cmH20-8 cmH20] 8 cmH20 Pressure Support:  [14 cmH20] 14 cmH20 Plateau Pressure:  [20 cmH20-29 cmH20] 29 cmH20   Intake/Output Summary (Last 24 hours) at 11/28/2019 1123 Last data filed at 11/28/2019 0500 Gross per 24 hour  Intake 1504.54 ml  Output 1845 ml  Net -340.46 ml   Filed Weights   11/26/19 0500 11/27/19 0500 11/28/19 0500  Weight: 72.4 kg 71.9 kg 72.8 kg   Vent Mode: PRVC FiO2 (%):  [50 %] 50 % Set Rate:  [18 bmp]  18 bmp Vt Set:  [450 mL] 450 mL PEEP:  [5 cmH20-8 cmH20] 8 cmH20 Pressure Support:  [14 cmH20] 14 cmH20 Plateau Pressure:  [20 cmH20-29 cmH20] 29 cmH20  Examination: General : Critically ill-appearing man lying in bed in no acute distress, intubated and sedated HENT: [place]/AT, eyes anicteric, ETT and OGT in place Lungs: CTAB, breathing comfortably on the ventilator.  Plateau pressure at goal. Heart: S1-S2, no murmurs, tachycardic, regular rhythm Abdomen: Soft, nontender, nondistended Extremities: No clubbing, cyanosis, or edema Skin: No rashes or wounds Neurologic: RASS  -5, pupils reactive bilaterally  CXR 3/28 personally reviewed-ongoing bilateral infiltrates, ET tube 4 cm above the carina.  LIJ CVC position unchanged at junction of innominate vein and SVC.   Resolved Hospital Problem list   Septic shock> resolved Thromoboctyopenia - new on 3/24 142->168  Assessment & Plan:  Acute resp failure and ARDS from Rhinovirus pneumonia:  +2L for this admission. -Continue low tidal volume ventilation, 4 to 8 cc/kg ideal body weight goal plateau less than 30 and driving pressure less than 15 -Daily SAT and SBT -VAP prevention protocol -Continue Dilaudid and propofol for sedation with goal RASS 0 to -1 -Family has previously been introduced to the idea of tracheostomy.  Today is intubation day #9- still have time for vent weaning prior to trach.  AKI; hyperkalemia resolved -checking urine Na+ & Cr -maintain euvolemia -Strict I's/O -Continue to monitor -Renally dose meds -Avoid nephrotoxic meds  Elevated troponin, downtrending.  LV without regional wall motion abnormalities on 3/24 echocardiogram. Question if troponin elevation is due to severe AS -Appreciate cardiology's input -Aspirin 81 cardiology recommendation -Metoprolol scheduled and as needed for heart rate control -LHC recommended as an outpatient  Concern for HAP; resolved -Completed 7-day course of cefepime on 11/27/2019  Persistent leukocytosis without obvious etiology.  Has remained on cefepime since admission on 3/20. Low but increasing PCT. -Sputum culture -UA with reflex culture  Anemia of critical illness -Transfuse for hemoglobin less than 7 -Continue to monitor  Hypernatremia -Continue free water flushes -Continue to monitor  Acute deep vein thrombosis involving the right peroneal and gastrocnemius veins. HIT negative. -Continue heparin  DM2 with hyperglycemia -Adding Levemir 20 units daily  -Adding tube feed coverage 5 units every 4 hours -Accu-Cheks every 4 hours with  sliding scale insulin as needed -Goal BG 140-180 while admitted to the ICU   Best practice:  Diet:  TF Pain/Anxiety/Delirium protocol (if indicated): diprivan and dilaudid VAP protocol (if indicated): nHOB ? 30 DVT prophylaxis: IV heparin Rx for DVT (stopped argatroban 3/26) GI prophylaxis: protonix Glucose control: ssi Mobility: bed rest Code Status: full Family Communication: Updated family at bedside on 3/28 Disposition: remain in ICU   LABS    PULMONARY Recent Labs  Lab 11/21/19 1400 11/22/19 0300  PHART 7.273* 7.327*  PCO2ART 52.8* 47.5  PO2ART 120* 93.3  HCO3 23.6 24.2  O2SAT 98.7 97.1    CBC Recent Labs  Lab 11/26/19 0501 11/27/19 0500 11/28/19 0346  HGB 11.2* 11.7* 10.5*  HCT 36.5* 39.1 35.8*  WBC 17.4* 24.6* 20.1*  PLT 185 236 203    COAGULATION Recent Labs  Lab 11/24/19 1324 11/28/19 0346  INR 1.1 1.3*    CARDIAC  No results for input(s): TROPONINI in the last 168 hours. No results for input(s): PROBNP in the last 168 hours.   CHEMISTRY Recent Labs  Lab 11/21/19 1720 11/21/19 1834 11/22/19 0300 11/23/19 0355 11/24/19 8502 11/24/19 7741 11/25/19 0436 11/25/19 0436 11/26/19 0501 11/26/19 0501 11/27/19  0500 11/28/19 0346  NA  --    < >  --    < > 144  --  146*  --  148*  --  153* 159*  K  --    < >  --    < > 3.9   < > 4.2   < > 4.0   < > 3.8 3.9  CL  --    < >  --    < > 111  --  108  --  108  --  111 116*  CO2  --    < >  --    < > 26  --  31  --  33*  --  32 36*  GLUCOSE  --    < >  --    < > 150*  --  158*  --  200*  --  219* 207*  BUN  --    < >  --    < > 59*  --  57*  --  60*  --  73* 86*  CREATININE  --    < >  --    < > 1.46*  --  1.56*  --  1.42*  --  1.75* 1.95*  CALCIUM  --    < >  --    < > 7.9*  --  8.1*  --  8.1*  --  7.9* 8.3*  MG 2.6*  --  2.7*  --  2.4  --   --   --  2.5*  --  2.5*  --   PHOS 7.8*  --  5.9*  --  3.3  --   --   --  3.2  --  4.2  --    < > = values in this interval not displayed.   Estimated  Creatinine Clearance: 29.7 mL/min (A) (by C-G formula based on SCr of 1.95 mg/dL (H)).   LIVER Recent Labs  Lab 11/23/19 0355 11/24/19 1324 11/25/19 0436 11/28/19 0346  AST 32  --  23 32  ALT 32  --  22 22  ALKPHOS 71  --  61 79  BILITOT 0.6  --  0.7 0.5  PROT 5.3*  --  5.3* 5.6*  ALBUMIN 2.2*  --  2.1* 1.8*  INR  --  1.1  --  1.3*     INFECTIOUS Recent Labs  Lab 11/24/19 1014 11/25/19 0436 11/25/19 0445 11/26/19 0501 11/28/19 0400  LATICACIDVEN  --   --  0.9  --  1.0  PROCALCITON 0.10 0.18  --  0.42  --      ENDOCRINE CBG (last 3)  Recent Labs    11/28/19 0000 11/28/19 0342 11/28/19 0807  GLUCAP 198* 176* 183*      This patient is critically ill with multiple organ system failure which requires frequent high complexity decision making, assessment, support, evaluation, and titration of therapies. This was completed through the application of advanced monitoring technologies and extensive interpretation of multiple databases. During this encounter critical care time was devoted to patient care services described in this note for 50 minutes.  Julian Hy, DO 11/28/19 4:10 PM [facility]

## 2019-11-28 NOTE — Progress Notes (Signed)
ANTICOAGULATION CONSULT NOTE - follow up  Pharmacy Consult for heparin Indication: DVT  No Active Allergies  Patient Measurements: Height: 5\' 7"  (170.2 cm) Weight: 160 lb 7.9 oz (72.8 kg) IBW/kg (Calculated) : 66.1 Heparin Dosing Weight: n/a. Use TBW 72 kg  Vital Signs: Temp: 97.6 F (36.4 C) (03/28 0400) Temp Source: Oral (03/28 0400) BP: 113/73 (03/28 0500) Pulse Rate: 107 (03/28 0500)  Labs: Recent Labs    11/26/19 0501 11/26/19 0501 11/26/19 1630 11/27/19 0200 11/27/19 0500 11/27/19 1000 11/28/19 0346  HGB 11.2*   < >  --   --  11.7*  --  10.5*  HCT 36.5*  --   --   --  39.1  --  35.8*  PLT 185  --   --   --  236  --  203  APTT 66*  --   --   --   --   --   --   LABPROT  --   --   --   --   --   --  16.1*  INR  --   --   --   --   --   --  1.3*  HEPARINUNFRC  --   --    < > 0.42  --  0.37 0.46  CREATININE 1.42*  --   --   --  1.75*  --  1.95*   < > = values in this interval not displayed.    Estimated Creatinine Clearance: 29.7 mL/min (A) (by C-G formula based on SCr of 1.95 mg/dL (H)).   Medical History: Past Medical History:  Diagnosis Date  . Aortic insufficiency   . Diabetes mellitus ([place])   . Dyslipidemia   . Essential hypertension   . 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 bacteremia 03/2019 with strep mitis.  . Severe aortic stenosis    Assessment: Pt admitted to [facility] on 3/20 as a transfer from [facility] where he was admitted for PNA/respiratory failure. Initially on enoxaparin 30 mg subQ daily for VTE ppx.  On 3/24, Plt dropped to 142 (initially 382 on 3/20); venous dopplar on 3/24 + right VTE. Argatroban initiated at that time for r/o HIT. HIT antibody negative, anticoagulation transitioned to heparin drip per pharmacy on 3/26 for VTE treatment.   Significant Events: -3/24: Venous dopplar + right DVT -3/24: Argatroban initiated for r/o HIT with Plt drip and +VTE -3/26: Transition AC to IV heparin as HIT antibody negative  (0.237), SRA negative  Today, 11/28/19  Heparin level therapeutic x 3 now on current IV heparin rate of 1400 units/hr  No reported bleeding  CBC ok  SCr 1.95, CrCl 30 mL/min. Worsening renal function  Goal of Therapy:  Heparin level 0.3-0.7 units/ml Monitor platelets by anticoagulation protocol: Yes   Plan:   Continue heparin infusion at current rate of 1400 units/hr  HL, CBC daily while on heparin   Monitor for signs/symptoms of bleeding  If patient to get trach, MD to provide guidance on when to hold heparin prior to procedure.   Kara Mead, PharmD 11/28/2019,5:53 AM

## 2019-11-29 ENCOUNTER — Inpatient Hospital Stay (HOSPITAL_COMMUNITY): Payer: Medicare HMO

## 2019-11-29 DIAGNOSIS — Z9911 Dependence on respirator [ventilator] status: Secondary | ICD-10-CM

## 2019-11-29 DIAGNOSIS — J181 Lobar pneumonia, unspecified organism: Secondary | ICD-10-CM

## 2019-11-29 LAB — GLUCOSE, CAPILLARY
Glucose-Capillary: 122 mg/dL — ABNORMAL HIGH (ref 70–99)
Glucose-Capillary: 130 mg/dL — ABNORMAL HIGH (ref 70–99)
Glucose-Capillary: 134 mg/dL — ABNORMAL HIGH (ref 70–99)
Glucose-Capillary: 169 mg/dL — ABNORMAL HIGH (ref 70–99)
Glucose-Capillary: 181 mg/dL — ABNORMAL HIGH (ref 70–99)
Glucose-Capillary: 60 mg/dL — ABNORMAL LOW (ref 70–99)
Glucose-Capillary: 78 mg/dL (ref 70–99)

## 2019-11-29 LAB — CBC
HCT: 35.9 % — ABNORMAL LOW (ref 39.0–52.0)
Hemoglobin: 10.2 g/dL — ABNORMAL LOW (ref 13.0–17.0)
MCH: 28 pg (ref 26.0–34.0)
MCHC: 28.4 g/dL — ABNORMAL LOW (ref 30.0–36.0)
MCV: 98.6 fL (ref 80.0–100.0)
Platelets: 214 10*3/uL (ref 150–400)
RBC: 3.64 MIL/uL — ABNORMAL LOW (ref 4.22–5.81)
RDW: 13.7 % (ref 11.5–15.5)
WBC: 18.6 10*3/uL — ABNORMAL HIGH (ref 4.0–10.5)
nRBC: 0.2 % (ref 0.0–0.2)

## 2019-11-29 LAB — BASIC METABOLIC PANEL
Anion gap: 10 (ref 5–15)
BUN: 93 mg/dL — ABNORMAL HIGH (ref 8–23)
CO2: 33 mmol/L — ABNORMAL HIGH (ref 22–32)
Calcium: 8.1 mg/dL — ABNORMAL LOW (ref 8.9–10.3)
Chloride: 109 mmol/L (ref 98–111)
Creatinine, Ser: 2.11 mg/dL — ABNORMAL HIGH (ref 0.61–1.24)
GFR calc Af Amer: 34 mL/min — ABNORMAL LOW (ref >60)
GFR calc non Af Amer: 29 mL/min — ABNORMAL LOW (ref >60)
Glucose, Bld: 195 mg/dL — ABNORMAL HIGH (ref 70–99)
Potassium: 4.3 mmol/L (ref 3.5–5.1)
Sodium: 152 mmol/L — ABNORMAL HIGH (ref 135–145)

## 2019-11-29 LAB — SODIUM, URINE, RANDOM: Sodium, Ur: <10 mmol/L

## 2019-11-29 LAB — CREATININE, URINE, RANDOM: Creatinine, Urine: 72.48 mg/dL

## 2019-11-29 LAB — BLOOD GAS, ARTERIAL
Acid-Base Excess: 4.2 mmol/L — ABNORMAL HIGH (ref 0.0–2.0)
Bicarbonate: 29.7 mmol/L — ABNORMAL HIGH (ref 20.0–28.0)
O2 Saturation: 98.2 %
Patient temperature: 98.6
pCO2 arterial: 53 mmHg — ABNORMAL HIGH (ref 32.0–48.0)
pH, Arterial: 7.368 (ref 7.350–7.450)
pO2, Arterial: 116 mmHg — ABNORMAL HIGH (ref 83.0–108.0)

## 2019-11-29 LAB — MRSA PCR SCREENING: MRSA by PCR: NEGATIVE

## 2019-11-29 LAB — BRAIN NATRIURETIC PEPTIDE: B Natriuretic Peptide: 760.9 pg/mL — ABNORMAL HIGH (ref 0.0–100.0)

## 2019-11-29 LAB — TRIGLYCERIDES: Triglycerides: 85 mg/dL (ref <150)

## 2019-11-29 LAB — HEPARIN LEVEL (UNFRACTIONATED): Heparin Unfractionated: 0.36 IU/mL (ref 0.30–0.70)

## 2019-11-29 LAB — PROCALCITONIN: Procalcitonin: 0.44 ng/mL

## 2019-11-29 MED ORDER — DEXTROSE 50 % IV SOLN
12.5000 g | INTRAVENOUS | Status: AC
  Administered 2019-11-29: 12.5 g via INTRAVENOUS

## 2019-11-29 MED ORDER — INSULIN ASPART 100 UNIT/ML ~~LOC~~ SOLN
2.0000 [IU] | SUBCUTANEOUS | Status: DC
  Administered 2019-11-29 – 2019-11-30 (×5): 2 [IU] via SUBCUTANEOUS

## 2019-11-29 MED ORDER — FENTANYL CITRATE (PF) 100 MCG/2ML IJ SOLN
25.0000 ug | Freq: Once | INTRAMUSCULAR | Status: AC
  Administered 2019-11-29: 25 ug via INTRAVENOUS
  Filled 2019-11-29: qty 2

## 2019-11-29 MED ORDER — FENTANYL CITRATE (PF) 100 MCG/2ML IJ SOLN: 25.0000 ug | INTRAMUSCULAR | Status: DC | PRN

## 2019-11-29 MED ORDER — PRO-STAT SUGAR FREE PO LIQD
30.0000 mL | Freq: Two times a day (BID) | ORAL | Status: DC
  Administered 2019-11-29 – 2019-11-30 (×2): 30 mL
  Filled 2019-11-29 (×2): qty 30

## 2019-11-29 MED ORDER — PIPERACILLIN-TAZOBACTAM 3.375 G IVPB
3.3750 g | Freq: Three times a day (TID) | INTRAVENOUS | Status: DC
  Administered 2019-11-29 – 2019-11-30 (×4): 3.375 g via INTRAVENOUS
  Filled 2019-11-29 (×4): qty 50

## 2019-11-29 MED ORDER — INSULIN ASPART 100 UNIT/ML ~~LOC~~ SOLN
0.0000 [IU] | SUBCUTANEOUS | Status: DC
  Administered 2019-11-29: 2 [IU] via SUBCUTANEOUS
  Administered 2019-11-30 (×3): 3 [IU] via SUBCUTANEOUS
  Administered 2019-11-30: 5 [IU] via SUBCUTANEOUS
  Administered 2019-11-30: 3 [IU] via SUBCUTANEOUS

## 2019-11-29 MED ORDER — VANCOMYCIN HCL 1500 MG/300ML IV SOLN: 1500.0000 mg | INTRAVENOUS | Status: DC

## 2019-11-29 MED ORDER — DEXTROSE 50 % IV SOLN
INTRAVENOUS | Status: AC
  Filled 2019-11-29: qty 50

## 2019-11-29 MED ORDER — FENTANYL BOLUS VIA INFUSION
25.0000 ug | INTRAVENOUS | Status: DC | PRN
  Administered 2019-11-29 – 2019-11-30 (×13): 25 ug via INTRAVENOUS
  Filled 2019-11-29: qty 25

## 2019-11-29 MED ORDER — FENTANYL CITRATE (PF) 100 MCG/2ML IJ SOLN
25.0000 ug | INTRAMUSCULAR | Status: DC | PRN
  Administered 2019-11-29 (×2): 25 ug via INTRAVENOUS
  Filled 2019-11-29 (×2): qty 2

## 2019-11-29 MED ORDER — DEXMEDETOMIDINE HCL IN NACL 200 MCG/50ML IV SOLN
0.0000 ug/kg/h | INTRAVENOUS | Status: DC
  Administered 2019-11-29: 0.4 ug/kg/h via INTRAVENOUS
  Filled 2019-11-29: qty 50

## 2019-11-29 MED ORDER — FENTANYL 2500MCG IN NS 250ML (10MCG/ML) PREMIX INFUSION
25.0000 ug/h | INTRAVENOUS | Status: DC
  Administered 2019-11-29: 25 ug/h via INTRAVENOUS
  Administered 2019-11-30: 14:00:00 75 ug/h via INTRAVENOUS
  Filled 2019-11-29 (×2): qty 250

## 2019-11-29 MED ORDER — VANCOMYCIN HCL 1500 MG/300ML IV SOLN
1500.0000 mg | Freq: Once | INTRAVENOUS | Status: AC
  Administered 2019-11-29: 1500 mg via INTRAVENOUS
  Filled 2019-11-29: qty 300

## 2019-11-29 MED ORDER — DEXMEDETOMIDINE HCL IN NACL 400 MCG/100ML IV SOLN
0.0000 ug/kg/h | INTRAVENOUS | Status: DC
  Administered 2019-11-29 (×2): 1 ug/kg/h via INTRAVENOUS
  Administered 2019-11-29 – 2019-11-30 (×4): 0.8 ug/kg/h via INTRAVENOUS
  Filled 2019-11-29 (×6): qty 100

## 2019-11-29 MED ORDER — NOREPINEPHRINE 4 MG/250ML-% IV SOLN
0.0000 ug/min | INTRAVENOUS | Status: DC
  Administered 2019-11-29: 18 ug/min via INTRAVENOUS
  Administered 2019-11-29: 2 ug/min via INTRAVENOUS
  Administered 2019-11-30: 16 ug/min via INTRAVENOUS
  Administered 2019-11-30: 3 ug/min via INTRAVENOUS
  Filled 2019-11-29 (×4): qty 250

## 2019-11-29 MED ORDER — OSMOLITE 1.2 CAL PO LIQD
1000.0000 mL | ORAL | Status: DC
  Administered 2019-11-29 – 2019-11-30 (×2): 1000 mL

## 2019-11-29 NOTE — Progress Notes (Signed)
Hypoglycemic Event  CBG: 60   Treatment: 12.5g D50  Symptoms: none  Follow-up CBG: Time: 1618 CBG Result: 78  Possible Reasons for Event: insulin dosing  Comments/MD notified: Merlene Laughter, NP     [place]

## 2019-11-29 NOTE — Progress Notes (Signed)
PT IV sedation paused at 0645 for daily WUA as instructed by dayshift nurse.

## 2019-11-29 NOTE — Progress Notes (Signed)
20 mL IV dilaudid from bag wasted with Polly Cobia, RN.

## 2019-11-29 NOTE — Progress Notes (Signed)
Pharmacy Antibiotic Note  Jeffrey Obrien is a 79 y.o. male admitted on 11/28/2019 from [facility] with pneumonia.  He previously completed 7 days of Cefepime on 3/27.  Pharmacy has been consulted for Zosyn and Vancomycin dosing for new suspected HCAP. MD reports new increased sputum, new fever with Tm 101.4, WBC 18.6 (decreasing)  Plan: Zosyn 3.375g IV Q8H infused over 4hrs.  Vancomycin 1500mg  IV q48h. Measure Vanc peak and trough at steady state.  Goal AUC = 400 - 550. Expected AUC 498 using SCr 2.11 Follow up renal function, culture results, and clinical course. Follow up MRSA PCR.   Height: 5\' 7"  (170.2 cm) Weight: 169 lb 15.6 oz (77.1 kg) IBW/kg (Calculated) : 66.1  Temp (24hrs), Avg:99 F (37.2 C), Min:98.3 F (36.8 C), Max:99.8 F (37.7 C)  Recent Labs  Lab 11/25/19 0436 11/25/19 0436 11/25/19 0445 11/26/19 0501 11/26/19 0501 11/27/19 0500 11/28/19 0346 11/28/19 0400 11/28/19 1741 11/28/19 2330 11/29/19 0414  WBC 17.3*  --   --  17.4*  --  24.6* 20.1*  --   --   --  18.6*  CREATININE 1.56*   < >  --  1.42*   < > 1.75* 1.95*  --  1.97* 2.08* 2.11*  LATICACIDVEN  --   --  0.9  --   --   --   --  1.0  --   --   --    < > = values in this interval not displayed.    Estimated Creatinine Clearance: 27.4 mL/min (A) (by C-G formula based on SCr of 2.11 mg/dL (H)).    No Active Allergies  Thank you for allowing pharmacy to be a part of this patient's care.  Gretta Arab PharmD, BCPS Pager: [phone] 11/29/2019 12:53 PM

## 2019-11-29 NOTE — Progress Notes (Signed)
Nutrition Follow-up  DOCUMENTATION CODES:   Not applicable  INTERVENTION:  - will adjust TF regimen: Osmolite 1.2 @ 60 ml/hr with 30 ml prostat BID. - this regimen will provide 1928 kcal, 110 grams protein, and 1181 ml free water. - free water flush to continue to be per MD/NP.    NUTRITION DIAGNOSIS:   Inadequate oral intake related to inability to eat as evidenced by NPO status. -ongoing  GOAL:   Patient will meet greater than or equal to 90% of their needs -met with TF regimen   MONITOR:   Vent status, TF tolerance, Labs, Weight trends  ASSESSMENT:   79 year old male with past medical history of severe aortic stenosis, HTN, and DM2 admitted on 3/20 from [facility] where he was admitted for severe acute respiratory failure with hypoxia secondary to  pneumonia on 3/15.  Patient remains intubated with OGT in place. He is receiving Vital 1.2 @ 45 ml/hr with 30 ml prostat TID and 200 ml free water every 2 hours. This provides 1596 kcal, 126 grams protein, and 3276 ml free water. Weight has been stable since admission on 3/20 until today when weight is significantly elevated. Re-estimated kcal need based on admission weight of 71 kg.   Per notes: - renal ultrasound on 3/28 was consistent with renal medical disease - acute respiratory failure  - ARDS 2/2 rhinovirus PNA - AKI - acute metabolic encephalopathy 2/2 acute illness and prolonged sedation - anemia of critical illness - hypernatremia--improving, 200 ml free water every 2 hours   Patient is currently intubated on ventilator support MV: 15.5 L/min Temp (24hrs), Avg:98.7 F (37.1 C), Min:97.7 F (36.5 C), Max:99.8 F (37.7 C) Propofol: none BP: 125/59 and MAP: 80   Labs reviewed; CBGs: 134 and 169 mg/dl, Na: 152 mmol/l, BUN: 93 mg/dl, creatinine: 2.11 mg/dl, Ca: 8.1 mg/dl. Medications reviewed; sliding scale novolog, 5 units novolog QID, 20 units levemir/day.  Drips; hepatin @ 1400 units/hr, precedex @ 0.8  mcg/kg/hr, fentanyl @ 50 mcg/hr.      NUTRITION - FOCUSED PHYSICAL EXAM:  completed; no muscle or fat wasting.   Diet Order:   Diet Order            Diet NPO time specified  Diet effective now              EDUCATION NEEDS:   No education needs have been identified at this time  Skin:  Skin Assessment: Skin Integrity Issues: Skin Integrity Issues:: Other (Comment) Other: pressure injury to sacrum (new: 3/27)  Last BM:  3/28  Height:   Ht Readings from Last 1 Encounters:  11/26/19 '5\' 7"'  (1.702 m)    Weight:   Wt Readings from Last 1 Encounters:  11/29/19 77.1 kg    Ideal Body Weight:  67.3 kg  BMI:  Body mass index is 26.62 kg/m.  Estimated Nutritional Needs:   Kcal:  1908 kcal  Protein:  107-121 grams (1.5-1.7 grams/kg)  Fluid:  >/= 1.7 L/day     Jarome Matin, MS, RD, LDN, CNSC Inpatient Clinical Dietitian RD pager # available in [place]  After hours/weekend pager # available in [facility]

## 2019-11-29 NOTE — Progress Notes (Addendum)
NAME:  Jeffrey Obrien, MRN:  [id number], DOB:  July 21, 1941, LOS: 9 ADMISSION DATE:  11/11/2019, CONSULTATION DATE:  3/20 REFERRING MD:  [facility], CHIEF COMPLAINT:  Dyspnea   Brief History   79 y/o male admitted on 3/20 from [facility] where he was admitted for pneumonia causing severe acute respiratory failure with hypoxemia on 3/15.    Past Medical History  Aortic stenosis, severe Hypertension DM2  Significant Hospital Events   3/15 admission [facility]  3/20 admission [facility], intubation, prone position  3/23- Heavily sedated. FiO2 needs down  11/24/2019:-Known supine ventilation with 40% FiO2.  On propofol and Dilaudid infusions.  On wake up assessment and spontaneous breathing trial quickly became tachypneic and failed attempted spontaneous breathing trial.  Not on pressors.  Making urine.  Improved Creat. White count coming down.  11/25/19 - Air.  Traps and has abdominal chest wall paradoxus when sedation decreased.  Cardiology consult recommend scheduled Lopressor.  Possible TEE recommended just prior to extubation to evaluate aortic valve for stenosis.  Outpatient cardiac cath recommended.  3/26 - febrile.  Last fever 2 days ago.  On 40% oxygen and 8 of PEEP.  Currently on pressure support 10/5 but well sedated with Dilaudid and propofol.  Pulse ox 89-90%.  Made good urine with Lasix yesterday.  Blood pressure adequate.  Not on pressors.  On Aargotroban  for DVT -> HITT negative -> changed to IV heparin  3/29 - sedation weaning, following mentation to see if trial of SBT would be tolerated    Consults:  Cardiology  Procedures:  3/20 ETT >  3/20 L IJ CVL >   Significant Diagnostic Tests:  3/15 CT angiogram chest OSH> no PE, marked severity diffuse bilateral partially ground-glass appearing infiltrates. Small bilateral pleural effusions, R > L. Received his second COVID vaccine 3 weeks prior to admission.  3/16 TTE > Mild to moderate aortic regurgitation, severe  aortic stenosis, peak gradient 39mHg, mild LVH, LVEF 55-60%, LA dilated.   3/24 TTE> severe AS, LVEF 50 to 55%.  No regional wall motion abnormalities.  Moderate LVH of the basal septal segment.  Normal LA, RV, RA.  3/24 duplex-RLE DVT peroneal, gastrocnemius veins  3/28 Renal UKorea> Slight increased echogenicity of the renal parenchyma consistent with renal medical disease. No other significant findings.  Micro Data:  3/15 SARS COV 2 > neg 3/18 SARS COV 2 > neg 3/15 blood culture > neg 3/20 sputum culture > normal flora 3/20 urine strep >  neg 3/20 urine legionella >  3/20 BAL >  Normal flor 3/20 BAL fungus >  3/20 BAL AFB > Rejected  3/20 blood > no growth 3/21 RVP> rhinovirus  Antimicrobials:  3/20 vanc >  3/24 3/20 cefepime > 3/27  Interim history/subjective:  RN reported 5kg weight gain overnight. Sedation weaned off this morning.   Objective   Blood pressure 139/64, pulse (!) 121, temperature 98.3 F (36.8 C), temperature source Axillary, resp. rate (!) 23, height _0  (1.702 m), weight 77.1 kg, SpO2 91 %. CVP:  [9 mmHg] 9 mmHg  Vent Mode: PRVC FiO2 (%):  [50 %] 50 % Set Rate:  [14 bmp-18 bmp] 14 bmp Vt Set:  [450 mL] 450 mL PEEP:  [8 cmH20] 8 cmH20 Pressure Support:  [14 cmH20] 14 cmH20 Plateau Pressure:  [25 cmH20-29 cmH20] 27 cmH20   Intake/Output Summary (Last 24 hours) at 11/29/2019 0807 Last data filed at 11/29/2019 0450 Gross per 24 hour  Intake 3967.37 ml  Output 1230  ml  Net 2737.37 ml   Filed Weights   11/27/19 0500 11/28/19 0500 11/29/19 0400  Weight: 71.9 kg 72.8 kg 77.1 kg   Vent Mode: PRVC FiO2 (%):  [50 %] 50 % Set Rate:  [14 bmp-18 bmp] 14 bmp Vt Set:  [450 mL] 450 mL PEEP:  [8 cmH20] 8 cmH20 Pressure Support:  [14 cmH20] 14 cmH20 Plateau Pressure:  [25 cmH20-29 cmH20] 27 cmH20  Examination: General: Chronically ill appearing elderly male on mechanical ventilation, in NAD HEENT: ETT, MM pink/moist, PERRL, sclera non-icteric  Neuro:  unable to follow commands with sedation stopped early this am.  CV: s1s2 regular rate and rhythm, no murmur, rubs, or gallops,  PULM:  Clear to ascultation bilaterally, mildly increased respiratory rate with cessation of sedation GI: soft, bowel sounds active in all 4 quadrants, non-tender, non-distended, tolerating TF Extremities: warm/dry, 1= pitting edema  Skin: no rashes or lesions  Resolved Hospital Problem list   Septic shock Thromoboctyopenia - new on 3/24 Hyperkalemia  Concern for HAP -Completed 7-day course of cefepime on 11/27/2019  Assessment & Plan:  Acute resp failure and ARDS from Rhinovirus pneumonia -RVP positive for Rhinovirius 3/21 -Family has previously been introduced to the idea of tracheostomy.  Today is intubation day #9- still have time for vent weaning prior to trach. Plan: Continue ventilator support with lung protective strategies  Sedation stopped this AM follow mentation for trial at SBT  Wean PEEP and FiO2 for sats greater than 90%. Head of bed elevated 30 degrees. Plateau pressures less than 30 cm H20.  Follow intermittent chest x-ray and ABG.   Ensure adequate pulmonary hygiene  Follow cultures  VAP bundle in place  PAD protocol Wean sedation as tolerated  RASS goal 0 to -1  AKI;  -On chart review it appears AKI on admission had resolved but creatinine is now up trending again  Plan: Urine Na+ and Cr pending  Goal for euvolemia  Strict I/O Follow renal function / urine output Trend Bmet Avoid nephrotoxins, ensure adequate renal perfusion  Enteral hydration  Severe AS Elevated troponin, downtrending.   -LV without regional wall motion abnormalities on 3/24 echocardiogram. Question if troponin elevation is due to severe AS -Cardiology recommends possible TEE just prior to extubation to evaluate aortic valve for stenosis.  Outpatient cardiac cath recommended. Plan: Cardiology evaluated and has no signed off  Continue ASA and beta blocker   Monitor vitals in the ICU setting   Acute metabolic encephalopathy  -Secondary to acute illness and prolonged sedation  -Sedation weaned off ~0600 on my assessment at [place] patient is unable to follow any commands and appears encephalopathic  Plan: Monitor mentation off sedation Consider head CT  Delirium precaution  Discontinue propofol and Dilaudid in favor of precedex and PRN Fentanyl    Persistent leukocytosis without obvious etiology.   -Completed 7-day course of cefepime on 11/27/2019 Plan: Follow cultures  Trend WBC and fever curve   Anemia of critical illness Plan: Transfuse per protocol  Hemoglobin goal greater than 7 Trend CBC   Hypernatremia, downtrending  Plan: Continue free water  Trend bmet  -Continue free water flushes -Continue to monitor  Acute deep vein thrombosis involving the right peroneal and gastrocnemius veins. HIT negative. Plan: Continue heparin drip  DM2 with hyperglycemia Plan: Continue SSI and Long acting insulin  Continue with TF coverage CBG q4hrs  BG goal 140-180   Best practice:  Diet:  TF Pain/Anxiety/Delirium protocol (if indicated): diprivan and dilaudid VAP protocol (if indicated):  nHOB ? 30 DVT prophylaxis: IV heparin Rx for DVT (stopped argatroban 3/26) GI prophylaxis: protonix Glucose control: ssi Mobility: bed rest Code Status: full Family Communication: Attempted to update spouse over the phone 3/29 with no answer will try again later  Disposition: remain in ICU   LABS    PULMONARY No results for input(s): PHART, PCO2ART, PO2ART, HCO3, TCO2, O2SAT in the last 168 hours.  Invalid input(s): PCO2, PO2  CBC Recent Labs  Lab 11/27/19 0500 11/28/19 0346 11/29/19 0414  HGB 11.7* 10.5* 10.2*  HCT 39.1 35.8* 35.9*  WBC 24.6* 20.1* 18.6*  PLT 236 203 214    COAGULATION Recent Labs  Lab 11/24/19 1324 11/28/19 0346  INR 1.1 1.3*    CARDIAC  No results for input(s): TROPONINI in the last 168 hours. No  results for input(s): PROBNP in the last 168 hours.   CHEMISTRY Recent Labs  Lab 11/24/19 0412 11/25/19 0436 11/26/19 0501 11/26/19 0501 11/27/19 0500 11/27/19 0500 11/28/19 0346 11/28/19 0346 11/28/19 1741 11/28/19 1741 11/28/19 2330 11/29/19 0414  NA 144   < > 148*   < > 153*  --  159*  --  155*  --  154* 152*  K 3.9   < > 4.0   < > 3.8   < > 3.9   < > 4.2   < > 4.1 4.3  CL 111   < > 108   < > 111  --  116*  --  111  --  111 109  CO2 26   < > 33*   < > 32  --  36*  --  36*  --  33* 33*  GLUCOSE 150*   < > 200*   < > 219*  --  207*  --  175*  --  144* 195*  BUN 59*   < > 60*   < > 73*  --  86*  --  88*  --  93* 93*  CREATININE 1.46*   < > 1.42*   < > 1.75*  --  1.95*  --  1.97*  --  2.08* 2.11*  CALCIUM 7.9*   < > 8.1*   < > 7.9*  --  8.3*  --  8.2*  --  8.2* 8.1*  MG 2.4  --  2.5*  --  2.5*  --   --   --   --   --   --   --   PHOS 3.3  --  3.2  --  4.2  --   --   --   --   --   --   --    < > = values in this interval not displayed.   Estimated Creatinine Clearance: 27.4 mL/min (A) (by C-G formula based on SCr of 2.11 mg/dL (H)).   LIVER Recent Labs  Lab 11/23/19 0355 11/24/19 1324 11/25/19 0436 11/28/19 0346  AST 32  --  23 32  ALT 32  --  22 22  ALKPHOS 71  --  61 79  BILITOT 0.6  --  0.7 0.5  PROT 5.3*  --  5.3* 5.6*  ALBUMIN 2.2*  --  2.1* 1.8*  INR  --  1.1  --  1.3*     INFECTIOUS Recent Labs  Lab 11/25/19 0436 11/25/19 0445 11/26/19 0501 11/28/19 0400 11/28/19 1136 11/29/19 0414  LATICACIDVEN  --  0.9  --  1.0  --   --   PROCALCITON   < >  --  0.42  --  0.52 0.44   < > = values in this interval not displayed.     ENDOCRINE CBG (last 3)  Recent Labs    11/28/19 2009 11/28/19 2308 11/29/19 0354  GLUCAP 176* 124* 134*      This patient is critically ill with multiple organ system failure which requires frequent high complexity decision making, assessment, support, evaluation, and titration of therapies. This was completed through the  application of advanced monitoring technologies and extensive interpretation of multiple databases. During this encounter critical care time was devoted to patient care services described in this note for 50 minutes.  Johnsie Cancel, DO 11/29/19 8:07 AM [facility]

## 2019-11-29 NOTE — Consult Note (Signed)
[place] Nurse Consult Note: Reason for Consult: DTPI noted to coccygeal area on 3/28. Light purple discoloration over the coccyx. Wound type:pressure, shear Pressure Injury POA: No Measurement:2cm x 1.2cm  Wound bed: N/A Drainage (amount, consistency, odor) N/A Periwound:intact Dressing procedure/placement/frequency: Patient is on vent and critically ill, is on a mattress replacement with low air loss feature with turning and repositioning in place, a silicone foam dressing covers the lesion and heels are floated.  I will add pressure redistribution heel boots and provide Nursing with guidance for assessing the lesion and changing the silicone foam.  Note: Patient is incontinent of loose stool and has a bowel management device (FlexiSeal) in place.  He is leaking stool around the device.  The patient also has an indwelling urinary catheter.  [facility] nursing team will follow and see every 7-10 days, and will remain available to this patient, the nursing and medical teams.  Please re-consult if needed between visits. Thanks, Maudie Flakes, MSN, RN, [place], Arther Abbott  Pager# [phone]

## 2019-11-29 NOTE — Progress Notes (Signed)
ANTICOAGULATION CONSULT NOTE - follow up  Pharmacy Consult for heparin Indication: DVT  No Active Allergies  Patient Measurements: Height: 5\' 7"  (170.2 cm) Weight: 169 lb 15.6 oz (77.1 kg) IBW/kg (Calculated) : 66.1 Heparin Dosing Weight: TBW 72 kg  Vital Signs: Temp: 98.3 F (36.8 C) (03/29 0400) Temp Source: Axillary (03/29 0400) BP: 136/60 (03/29 0600) Pulse Rate: 117 (03/29 0600)  Labs: Recent Labs    11/27/19 0200 11/27/19 0500 11/27/19 0500 11/27/19 1000 11/28/19 0346 11/28/19 0346 11/28/19 1136 11/28/19 1741 11/28/19 2330 11/29/19 0414  HGB  --  11.7*   < >  --  10.5*  --   --   --   --  10.2*  HCT  --  39.1  --   --  35.8*  --   --   --   --  35.9*  PLT  --  236  --   --  203  --   --   --   --  214  LABPROT  --   --   --   --  16.1*  --   --   --   --   --   INR  --   --   --   --  1.3*  --   --   --   --   --   HEPARINUNFRC   < >  --   --  0.37 0.46  --   --   --   --  0.36  CREATININE  --  1.75*   < >  --  1.95*   < >  --  1.97* 2.08* 2.11*  CKTOTAL  --   --   --   --  30*  --   --   --   --   --   CKMB  --   --   --   --  3.7  --   --   --   --   --   TROPONINIHS  --   --   --   --   --   --  1,634*  --   --   --    < > = values in this interval not displayed.    Estimated Creatinine Clearance: 27.4 mL/min (A) (by C-G formula based on SCr of 2.11 mg/dL (H)).   Medical History: Past Medical History:  Diagnosis Date  . Aortic insufficiency   . Diabetes mellitus ([place])   . Dyslipidemia   . Essential hypertension   . 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 bacteremia 03/2019 with strep mitis.  . Severe aortic stenosis    Assessment: Pt admitted to [facility] on 3/20 as a transfer from [facility] where he was admitted for PNA/respiratory failure. Initially on enoxaparin 30 mg subQ daily for VTE ppx.  On 3/24, Plt dropped to 142 (initially 382 on 3/20); venous dopplar on 3/24 + right VTE. Argatroban initiated at that time for r/o HIT. HIT  antibody negative, SRA negative.  Anticoagulation transitioned back to heparin drip per pharmacy on 3/26 for VTE treatment.   Significant Events: -3/24: Venous dopplar + right DVT -3/24: Argatroban initiated for r/o HIT with Plt drip and +VTE -3/26: Transition AC to IV heparin as HIT antibody negative, SRA negative  Today, 11/29/19  Heparin level remains therapeutic, 0.36, on IV heparin at 1400 units/hr  No reported bleeding  CBC:  Hgb remains low/stable at 10.2, Plt wnl.  SCr increased to  2.11  Goal of Therapy:  Heparin level 0.3-0.7 units/ml Monitor platelets by anticoagulation protocol: Yes   Plan:   Continue heparin infusion at current rate of 1400 units/hr  HL, CBC daily while on heparin   Monitor for signs/symptoms of bleeding  Follow up plans for potential trach (end of week)   Gretta Arab PharmD, BCPS Pager: [phone] 11/29/2019 7:50 AM

## 2019-11-29 NOTE — Procedures (Signed)
Arterial Catheter Insertion Procedure Note Jeffrey Obrien OO:[id number] 1941-05-16  Procedure: Insertion of Arterial Catheter  Indications: Blood pressure monitoring and Frequent blood sampling  Procedure Details Consent: Risks of procedure as well as the alternatives and risks of each were explained to the (patient/caregiver).  Consent for procedure obtained. Time Out: Verified patient identification, verified procedure, site/side was marked, verified correct patient position, special equipment/implants available, medications/allergies/relevent history reviewed, required imaging and test results available.  Performed  Maximum sterile technique was used including antiseptics, cap, gloves, gown, hand hygiene, mask and sheet. Skin prep: Chlorhexidine; local anesthetic administered 20 gauge catheter was inserted into right radial artery using the Seldinger technique. ULTRASOUND GUIDANCE USED: YES Evaluation Blood flow good; BP tracing good. Complications: No apparent complications.  Johnsie Cancel, NP-C [facility] Contact / Pager information can be found on Amion  11/29/2019, 5:35 PM

## 2019-11-30 ENCOUNTER — Inpatient Hospital Stay (HOSPITAL_COMMUNITY): Payer: Medicare HMO

## 2019-11-30 DIAGNOSIS — E1165 Type 2 diabetes mellitus with hyperglycemia: Secondary | ICD-10-CM

## 2019-11-30 DIAGNOSIS — Z978 Presence of other specified devices: Secondary | ICD-10-CM

## 2019-11-30 DIAGNOSIS — Z794 Long term (current) use of insulin: Secondary | ICD-10-CM

## 2019-11-30 DIAGNOSIS — J80 Acute respiratory distress syndrome: Secondary | ICD-10-CM

## 2019-11-30 DIAGNOSIS — N179 Acute kidney failure, unspecified: Secondary | ICD-10-CM

## 2019-11-30 LAB — GLUCOSE, CAPILLARY
Glucose-Capillary: 198 mg/dL — ABNORMAL HIGH (ref 70–99)
Glucose-Capillary: 176 mg/dL — ABNORMAL HIGH (ref 70–99)
Glucose-Capillary: 212 mg/dL — ABNORMAL HIGH (ref 70–99)
Glucose-Capillary: 175 mg/dL — ABNORMAL HIGH (ref 70–99)
Glucose-Capillary: 177 mg/dL — ABNORMAL HIGH (ref 70–99)

## 2019-11-30 LAB — CBC
HCT: 31.2 % — ABNORMAL LOW (ref 39.0–52.0)
Hemoglobin: 9.3 g/dL — ABNORMAL LOW (ref 13.0–17.0)
MCH: 28.4 pg (ref 26.0–34.0)
MCHC: 29.8 g/dL — ABNORMAL LOW (ref 30.0–36.0)
MCV: 95.1 fL (ref 80.0–100.0)
Platelets: 222 10*3/uL (ref 150–400)
RBC: 3.28 MIL/uL — ABNORMAL LOW (ref 4.22–5.81)
RDW: 13.6 % (ref 11.5–15.5)
WBC: 30 10*3/uL — ABNORMAL HIGH (ref 4.0–10.5)
nRBC: 0.1 % (ref 0.0–0.2)

## 2019-11-30 LAB — BASIC METABOLIC PANEL WITH GFR
Anion gap: 12 (ref 5–15)
BUN: 119 mg/dL — ABNORMAL HIGH (ref 8–23)
CO2: 27 mmol/L (ref 22–32)
Calcium: 7.2 mg/dL — ABNORMAL LOW (ref 8.9–10.3)
Chloride: 106 mmol/L (ref 98–111)
Creatinine, Ser: 3.14 mg/dL — ABNORMAL HIGH (ref 0.61–1.24)
GFR calc Af Amer: 21 mL/min — ABNORMAL LOW
GFR calc non Af Amer: 18 mL/min — ABNORMAL LOW
Glucose, Bld: 232 mg/dL — ABNORMAL HIGH (ref 70–99)
Potassium: 4.1 mmol/L (ref 3.5–5.1)
Sodium: 145 mmol/L (ref 135–145)

## 2019-11-30 LAB — TRIGLYCERIDES: Triglycerides: 68 mg/dL

## 2019-11-30 LAB — PROCALCITONIN: Procalcitonin: 5.72 ng/mL

## 2019-11-30 LAB — HEPARIN LEVEL (UNFRACTIONATED): Heparin Unfractionated: 0.4 IU/mL (ref 0.30–0.70)

## 2019-11-30 MED ORDER — FUROSEMIDE 10 MG/ML IJ SOLN
80.0000 mg | Freq: Four times a day (QID) | INTRAMUSCULAR | Status: AC
  Administered 2019-11-30 (×2): 80 mg via INTRAVENOUS
  Filled 2019-11-30 (×2): qty 8

## 2019-11-30 MED ORDER — INSULIN ASPART 100 UNIT/ML ~~LOC~~ SOLN
3.0000 [IU] | SUBCUTANEOUS | Status: DC
  Administered 2019-11-30: 3 [IU] via SUBCUTANEOUS

## 2019-11-30 MED ORDER — FREE WATER
200.0000 mL | Freq: Four times a day (QID) | Status: DC
  Administered 2019-11-30: 200 mL

## 2019-11-30 MED FILL — Medication: Qty: 1 | Status: AC

## 2019-12-01 LAB — TYPE AND SCREEN
Unit division: 0
Unit division: 0

## 2019-12-01 LAB — BPAM RBC
Blood Product Expiration Date: 202104182359
Blood Product Expiration Date: 202104202359
ISSUE DATE / TIME: 202103302017
ISSUE DATE / TIME: 202103302017
Unit Type and Rh: 9500
Unit Type and Rh: 9500

## 2019-12-02 LAB — CULTURE, RESPIRATORY W GRAM STAIN
Culture: NORMAL
Culture: NORMAL

## 2019-12-02 NOTE — Plan of Care (Signed)
Code blue called overhead. When I arrived the code was underwa. I called Jeffrey Obrien's wife Jeffrey Obrien and daughter Jeffrey Obrien to notify them. Jeffrey Obrien is on her way to the hospital.   Julian Hy, DO December 17, 2019 8:39 PM [facility]

## 2019-12-02 NOTE — Progress Notes (Signed)
Fentanyl bolus given for increased work of breathing with abdominal muscle use. RR 37 breathing over vent.

## 2019-12-02 NOTE — Progress Notes (Signed)
NAME:  Jeffrey Obrien, MRN:  [id number], DOB:  27-Oct-1940, LOS: 10 ADMISSION DATE:  11/04/2019, CONSULTATION DATE:  3/20 REFERRING MD:  [facility], CHIEF COMPLAINT:  Dyspnea   Brief History   79 y/o male admitted on 3/20 from [facility] where he was admitted for pneumonia causing severe acute respiratory failure with hypoxemia on 3/15.    Past Medical History  Aortic stenosis, severe Hypertension DM2  Significant Hospital Events   3/15 admission [facility]  3/20 admission [facility], intubation, prone position  3/23- Heavily sedated. FiO2 needs down  11/24/2019:-Known supine ventilation with 40% FiO2.  On propofol and Dilaudid infusions.  On wake up assessment and spontaneous breathing trial quickly became tachypneic and failed attempted spontaneous breathing trial.  Not on pressors.  Making urine.  Improved Creat. White count coming down.  11/25/19 - Air.  Traps and has abdominal chest wall paradoxus when sedation decreased.  Cardiology consult recommend scheduled Lopressor.  Possible TEE recommended just prior to extubation to evaluate aortic valve for stenosis.  Outpatient cardiac cath recommended.  3/26 - febrile.  Last fever 2 days ago.  On 40% oxygen and 8 of PEEP.  Currently on pressure support 10/5 but well sedated with Dilaudid and propofol.  Pulse ox 89-90%.  Made good urine with Lasix yesterday.  Blood pressure adequate.  Not on pressors.  On Aargotroban  for DVT -> HITT negative -> changed to IV heparin  3/29 - Attempted to wean sedation in the AM but he did not tolerate. In the afternoon patient started to require increased FiO2 requirements, hypotension, with signs of early PNA on imagining. antibiotics started   3/30 - FiO2 requirements down, WBC elevated with increased creatinine as well, Diuresed today.   Consults:  Cardiology  Procedures:  3/20 ETT >  3/20 L IJ CVL >   Significant Diagnostic Tests:  3/15 CT angiogram chest OSH> no PE, marked severity  diffuse bilateral partially ground-glass appearing infiltrates. Small bilateral pleural effusions, R > L. Received his second COVID vaccine 3 weeks prior to admission.  3/16 TTE > Mild to moderate aortic regurgitation, severe aortic stenosis, peak gradient 99mHg, mild LVH, LVEF 55-60%, LA dilated.   3/24 TTE> severe AS, LVEF 50 to 55%.  No regional wall motion abnormalities.  Moderate LVH of the basal septal segment.  Normal LA, RV, RA.  3/24 duplex-RLE DVT peroneal, gastrocnemius veins  3/28 Renal UKorea> Slight increased echogenicity of the renal parenchyma consistent with renal medical disease. No other significant findings.  Micro Data:  3/15 SARS COV 2 > neg 3/18 SARS COV 2 > neg 3/15 blood culture > neg 3/20 sputum culture > normal flora 3/20 urine strep >  neg 3/20 urine legionella >  3/20 BAL >  Normal flor 3/20 BAL fungus >  3/20 BAL AFB > Rejected  3/20 blood > no growth 3/21 RVP> rhinovirus  Antimicrobials:  3/20 vanc >  3/24 3/20 cefepime > 3/27  Interim history/subjective:  RN reports no significant events overnight, weight up again total of 8kg over the last few days. Urine out put decreased ~9026m    Objective   Blood pressure (!) 166/38, pulse (!) 37, temperature 98 F (36.7 C), temperature source Axillary, resp. rate (!) 36, height _0  (1.702 m), weight 79.6 kg, SpO2 95 %. CVP:  [7 mmHg-14 mmHg] 14 mmHg  Vent Mode: PRVC FiO2 (%):  [40 %-100 %] 50 % Set Rate:  [22 bmp] 22 bmp Vt Set:  [450 mL] 450 mL  PEEP:  [10 cmH20] 10 cmH20 Plateau Pressure:  [25 cmH20-33 cmH20] 28 cmH20   Intake/Output Summary (Last 24 hours) at 2019-12-04 0848 Last data filed at 12-04-19 0700 Gross per 24 hour  Intake 4633.09 ml  Output 1315 ml  Net 3318.09 ml   Filed Weights   11/28/19 0500 11/29/19 0400 12-04-19 0500  Weight: 72.8 kg 77.1 kg 79.6 kg   Vent Mode: PRVC FiO2 (%):  [40 %-100 %] 50 % Set Rate:  [22 bmp] 22 bmp Vt Set:  [450 mL] 450 mL PEEP:  [10 cmH20] 10  cmH20 Plateau Pressure:  [25 cmH20-33 cmH20] 28 cmH20  Examination: General: Chronically ill appearing elderly male on mechanical ventilation, in NAD HEENT: ETT, MM pink/moist, PERRL, sclera non-icteric  Neuro: Will grimace to painful stimuli, sedated on vent  CV: s1s2 regular rate and rhythm, no murmur, rubs, or gallops,  PULM:  Bilateral rhonchi and crackles, left greater than right, mildly increased work of breathing on vent, tachypnea  GI: soft, bowel sounds active in all 4 quadrants, non-tender, non-distended, tolerating TF Extremities: warm/dry, 1+ edema  Skin: no rashes or lesions  Resolved Hospital Problem list   Septic shock 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  Today is intubation day #9- still have time for vent weaning prior to trach. Plan: Continue ventilator support with lung protective strategies  Wean PEEP and FiO2 for sats greater than 90%. Head of bed elevated 30 degrees. Plateau pressures less than 30 cm H20 Follow intermittent chest x-ray and ABG   SAT/SBT once FiO2 requirements improve, mentation preclude extubation  Ensure adequate pulmonary hygiene  Follow cultures  VAP bundle in place  PAD protocol RASS goal 0 to -1  Concern for bacterial LLL and RLL pneumonia  -Afternoon 3/29 patient started to require increased FiO2 requirements, developed hypotension, with signs of early PNA on imagining: Plan: Follow sputum culture  Continue vent support as above  Continue IV antibiotics  MAP goal greater than 65 Wean pressors as able  Monitor urine output Goal of negative fluid balance, diurese today   AKI -On chart review it appears AKI on admission had resolved but creatinine is now up  trending again  Plan: Follow renal function / urine output Trend Bmet Avoid nephrotoxins, ensure adequate renal perfusion  Enteral hydration Will need to diurese today, follow Bmet closely   Severe AS Elevated troponin, downtrending.   -LV without regional wall motion abnormalities on 3/24 echocardiogram. Question if troponin elevation is due to severe AS -Cardiology recommends possible TEE just prior to extubation to evaluate aortic valve for stenosis.  Outpatient cardiac cath recommended. Plan: Cardiology seen during admission now signed off  Continue ASA and Beta blocker  Monitor vitals  Aggressively diurese today   Acute metabolic encephalopathy  -Secondary to acute illness and prolonged sedation  -Sedation weaned off ~0600 on my assessment at [place] patient is unable to follow any commands and appears encephalopathic  Plan: Minimize sedation as able  Delirium precaution PAD protocol   Anemia of critical illness Plan: Transfuse per protocol  Hemoglobin goal greater than 7 Trend CBC   Acute deep vein thrombosis involving the right peroneal and gastrocnemius veins. HIT negative. Plan: Continue heparin drip   DM2 with hyperglycemia Plan: SSI decreased to moderate scale  Decreased tube feed coverage  Continue Long acting insulin  CBG q4hrs  Best practice:  Diet:  TF Pain/Anxiety/Delirium protocol (if indicated): Precedex and fentanyl  VAP protocol (if indicated): nHOB ? 30 DVT prophylaxis: IV heparin Rx for DVT (stopped argatroban 3/26) GI prophylaxis: protonix Glucose control: ssi Mobility: bed rest Code Status: full Family Communication: Will update again today at bedside  Disposition: remain in ICU   LABS    PULMONARY Recent Labs  Lab 11/29/19 1624  PHART 7.368  PCO2ART 53.0*  PO2ART 116*  HCO3 29.7*  O2SAT 98.2    CBC Recent Labs  Lab 11/28/19 0346 11/29/19 0414 12/07/2019 0322  HGB 10.5* 10.2* 9.3*  HCT 35.8* 35.9* 31.2*  WBC 20.1* 18.6*  30.0*  PLT 203 214 222    COAGULATION Recent Labs  Lab 11/24/19 1324 11/28/19 0346  INR 1.1 1.3*    CARDIAC  No results for input(s): TROPONINI in the last 168 hours. No results for input(s): PROBNP in the last 168 hours.   CHEMISTRY Recent Labs  Lab 11/24/19 0412 11/25/19 0436 11/26/19 0501 11/26/19 0501 11/27/19 0500 11/27/19 0500 11/28/19 0346 11/28/19 0346 11/28/19 1741 11/28/19 1741 11/28/19 2330 11/28/19 2330 11/29/19 0414 2019/12/07 0322  NA 144   < > 148*   < > 153*   < > 159*  --  155*  --  154*  --  152* 145  K 3.9   < > 4.0   < > 3.8   < > 3.9   < > 4.2   < > 4.1   < > 4.3 4.1  CL 111   < > 108   < > 111   < > 116*  --  111  --  111  --  109 106  CO2 26   < > 33*   < > 32   < > 36*  --  36*  --  33*  --  33* 27  GLUCOSE 150*   < > 200*   < > 219*   < > 207*  --  175*  --  144*  --  195* 232*  BUN 59*   < > 60*   < > 73*   < > 86*  --  88*  --  93*  --  93* 119*  CREATININE 1.46*   < > 1.42*   < > 1.75*   < > 1.95*  --  1.97*  --  2.08*  --  2.11* 3.14*  CALCIUM 7.9*   < > 8.1*   < > 7.9*   < > 8.3*  --  8.2*  --  8.2*  --  8.1* 7.2*  MG 2.4  --  2.5*  --  2.5*  --   --   --   --   --   --   --   --   --   PHOS 3.3  --  3.2  --  4.2  --   --   --   --   --   --   --   --   --    < > = values in this interval not displayed.   Estimated Creatinine Clearance: 19.9 mL/min (A) (by C-G formula based on SCr of 3.14 mg/dL (H)).   LIVER Recent Labs  Lab 11/24/19 1324 11/25/19 0436 11/28/19 0346  AST  --  23 32  ALT  --  22 22  ALKPHOS  --  61 79  BILITOT  --  0.7 0.5  PROT  --  5.3* 5.6*  ALBUMIN  --  2.1* 1.8*  INR 1.1  --  1.3*     INFECTIOUS Recent Labs  Lab 11/25/19 0445 11/26/19 0501 11/28/19 0400 11/28/19 1136 11/29/19 0414 12-06-2019 0322  LATICACIDVEN 0.9  --  1.0  --   --   --   PROCALCITON  --    < >  --  0.52 0.44 5.72   < > = values in this interval not displayed.     ENDOCRINE CBG (last 3)  Recent Labs    11/29/19 2338  06-Dec-2019 0313 Dec 06, 2019 0817  GLUCAP 181* 198* 176*    CRITICAL CARE Performed by: Johnsie Cancel  Total critical care time: 38 minutes  Critical care time was exclusive of separately billable procedures and treating other patients.  Critical care was necessary to treat or prevent imminent or life-threatening deterioration.  Critical care was time spent personally by me on the following activities: development of treatment plan with patient and/or surrogate as well as nursing, discussions with consultants, evaluation of patient's response to treatment, examination of patient, obtaining history from patient or surrogate, ordering and performing treatments and interventions, ordering and review of laboratory studies, ordering and review of radiographic studies, pulse oximetry and re-evaluation of patient's condition.  Johnsie Cancel, NP-C [facility] Contact / Pager information can be found on Amion  06-Dec-2019, 9:11 AM

## 2019-12-02 NOTE — Death Summary Note (Signed)
Physician Discharge Summary   Patient ID: Jeffrey Obrien OO:[id number] 79 y.o. 10-Jul-1941  Admit date: 11/17/2019  Discharge date and time: No discharge date for patient encounter.   Admitting Physician: Juanito Doom, MD   Discharge Physician: Julian Hy  Admission Diagnoses: Acute respiratory failure with hypoxemia ([place]) [J96.01]  Discharge Diagnoses:  Acute hypoxic and hypercapneic respiratory failure ARDS Rhinovirus pneumonia Acute lobar pneumonia AKI Severe aortic stenosis Hypervolemia Diabetes with hyperglycemia Acute metabolic encephalopathy Anemia of critical illness DVT  Admission Condition: critical  Discharged Condition: deceased  Indication for Admission: Acute hypoxic respiratory failure  Hospital Course:  [name] was admitted with severe acute respiratory failure and ARDS from rhinovirus pneumonia. Due to severe respiratory failure he required prone positioning, NMB, and vent management per ARDS guidelines. He remained on antibiotics throughout. Diuretics for volume management.  DVT- treated with heparin   Diabetes with hyperglycemia- managed with insulin  AKI- possible cardiorenal vs ATN from sepsis managed with supportive care  Metabolic encephalopathy- difficult to management with need for ongoing sedation for vent support   He suffered a bradycardic PEA arrest after becoming suddenly hypotensive. Code run per ACLS protocol. Family notified of sudden change in status.  Consults: cardiology and pulmonary/intensive care  Treatments: IV hydration, antibiotics: vancomycin and cefepime, Zosyn, analgesia to tolerate MV, cardiac meds: vasopressors, beta blockers, anticoagulation: heparin, insulin: and respiratory therapy: mechanical ventilation  Disposition: funeral home of the family's choosing   Time of death 20:26 on 12/16/19.  Signed: Julian Hy Dec 16, 2019 8:40 PM

## 2019-12-02 NOTE — Progress Notes (Signed)
Wife wishes for daughter to be contacted by funeral home. Daughter: Alena Bills [phone].

## 2019-12-02 NOTE — Progress Notes (Addendum)
Patient's daughters arrived and spoke with this RN and Dr. Carlis Abbott present and spoke with daughters also. Offered condolences. Hospital chaplain in room.

## 2019-12-02 NOTE — Progress Notes (Signed)
ANTICOAGULATION CONSULT NOTE - follow up  Pharmacy Consult for heparin Indication: DVT  No Active Allergies  Patient Measurements: Height: 5\' 7"  (170.2 cm) Weight: 175 lb 7.8 oz (79.6 kg) IBW/kg (Calculated) : 66.1 Heparin Dosing Weight: TBW 72 kg  Vital Signs: Temp: 98 F (36.7 C) (03/30 0351) Temp Source: Axillary (03/30 0351) BP: 166/38 (03/30 0600) Pulse Rate: 37 (03/30 0730)  Labs: Recent Labs    11/28/19 0346 11/28/19 0346 11/28/19 1136 11/28/19 1741 11/28/19 2330 11/29/19 0414 December 12, 2019 0322  HGB 10.5*   < >  --   --   --  10.2* 9.3*  HCT 35.8*  --   --   --   --  35.9* 31.2*  PLT 203  --   --   --   --  214 222  LABPROT 16.1*  --   --   --   --   --   --   INR 1.3*  --   --   --   --   --   --   HEPARINUNFRC 0.46  --   --   --   --  0.36 0.40  CREATININE 1.95*  --   --    < > 2.08* 2.11* 3.14*  CKTOTAL 30*  --   --   --   --   --   --   CKMB 3.7  --   --   --   --   --   --   TROPONINIHS  --   --  1,634*  --   --   --   --    < > = values in this interval not displayed.    Estimated Creatinine Clearance: 19.9 mL/min (A) (by C-G formula based on SCr of 3.14 mg/dL (H)).   Medical History: Past Medical History:  Diagnosis Date  . Aortic insufficiency   . Diabetes mellitus ([place])   . Dyslipidemia   . Essential hypertension   . 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 bacteremia 03/2019 with strep mitis.  . Severe aortic stenosis    Assessment: Pt admitted to [facility] on 3/20 as a transfer from [facility] where he was admitted for PNA/respiratory failure. Initially on enoxaparin 30 mg subQ daily for VTE ppx.  On 3/24, Plt dropped to 142 (initially 382 on 3/20); venous dopplar on 3/24 + right VTE. Argatroban initiated at that time for r/o HIT. HIT antibody negative, SRA negative.  Anticoagulation transitioned back to heparin drip per pharmacy on 3/26 for VTE treatment.   Significant Events: -3/24: Venous dopplar + right DVT -3/24: Argatroban  initiated for r/o HIT with Plt drip and +VTE -3/26: Transition AC to IV heparin as HIT antibody negative, SRA negative  Today, 2019-12-12  Heparin level remains therapeutic, 0.4, on IV heparin at 1400 units/hr  CBC:  Hgb further decreased to 9.3, Plt WNL.   No reported bleeding or complications  SCr increased to 3.14  Goal of Therapy:  Heparin level 0.3-0.7 units/ml Monitor platelets by anticoagulation protocol: Yes   Plan:   Continue heparin infusion at current rate of 1400 units/hr  HL, CBC daily while on heparin   Monitor for signs/symptoms of bleeding  Follow up plans for potential trach (end of week)   Gretta Arab PharmD, BCPS Pager: [phone] December 12, 2019 7:52 AM

## 2019-12-02 NOTE — Consult Note (Addendum)
Responded to CODE BLUE in the ICU. Patient is elderly, recently admitted for ARDS, is intubated, has severe aortic stenosis, worsening renal function, hypotensive and on Levophed. Shortly prior to the [place], patient became significantly tachypneic and tachycardic and then became unresponsive, pulseless, CPR initiated. On my arrival CPR is in progress, first dose of epinephrine given. Patient was in PEA arrest for the first 3 pulse checks. Given epinephrine every 3 minutes. Empirically given a total of 2 A of bicarb and 1 amp of calcium chloride, also given amp of D50. 2 g magnesium also given. Patient transition to ventricular tachycardia, was shocked at 200 J. On the next 2 pulse and rhythm checks he continued to be pulseless but then exhibited ventricular fibrillation, shocked for each occurrence. Patient profusely bleeding from his ET tube, extended code length now at least 20 minutes, concern for very poor neurologic outcome regardless of ACLS outcome. On final pulse and rhythm check, patient had transition to full asystole, bedside ultrasound was used to confirm complete cardiac standstill, time of death was called at 8:26pm.  CPR  Date/Time: 12/18/2019 8:42 PM Performed by: Maudie Flakes, MD Authorized by: Maudie Flakes, MD  Consent: The procedure was performed in an emergent situation. Comments: 15 minutes of CPR  Ultrasound ED Echo  Date/Time: 2019/12/18 8:42 PM Performed by: Maudie Flakes, MD Authorized by: Maudie Flakes, MD   Procedure details:    Indications: cardiac arrest     Views: subxiphoid   Findings:    Cardiac Activity: no cardiac activity   Defibrillation  Date/Time: 12-18-19 8:43 PM Performed by: Maudie Flakes, MD Authorized by: Maudie Flakes, MD  Consent: The procedure was performed in an emergent situation. Comments: For defibrillation attempts in response to ventricular tachycardia or ventricular fibrillation  .Critical Care Performed by: Maudie Flakes, MD Authorized by: Maudie Flakes, MD  Total critical care time: 31 minutes Critical care time was exclusive of separately billable procedures and treating other patients. Critical care was necessary to treat or prevent imminent or life-threatening deterioration of the following conditions: circulatory failure. Critical care was time spent personally by me on the following activities: re-evaluation of patient's condition, ordering and performing treatments and interventions, examination of patient, obtaining history from patient or surrogate, evaluation of patient's response to treatment and discussions with consultants.

## 2019-12-02 NOTE — Progress Notes (Addendum)
Nurse went in room just before 12-13-1998 because patient desaturation on monitor, labored breathing, also hypotensive.  Increased levophed drip. Suctioned patient with tan secretions coming out of ETT. Frothy secretions coming from mouth. Initially sinus tach on monitor rate 120 and then quickly brady into 60's. Art line without wave form. Palpated for femoral pulse and no pulse palpated therefore called Code blue and started chest compressions at 12/12/01. Dr. Oletta Darter camera-ed in room from [facility] and Dr. Sedonia Small from ED came along with Dr. Myrna Blazer present.  Charge nurse and other nursing team members along with RT all at bedside. Followed ACLS protocol. See code blue sheet.  This RN called patient's wife within 10 minutes of Code starting to let her know that patient was receiving CPR due to his heart stopping.  Dr. Carlis Abbott also called patient's wife during code to discuss with wife. Dr. Sedonia Small called Code at 12/12/2024 and pronounced patient's time of death at 12-12-2024. See code blue sheet for details.

## 2019-12-02 NOTE — Progress Notes (Addendum)
Upon arrival pt's daughters were bedside. They were both appropriately tearful. His wife and younger sister arrived about 40 minutes later. Pt's wife was deeply grieved but appropriately tearful as was his sister. His wife talked to him, held his hand and also found prayer comforting. We had prayer bedside with wife, daughters, and sister. Family was appreciative of support and prayer. Chaplain Marlise Eves, MDiv   12/03/2019 2200  Clinical Encounter Type  Visited With Family   After posting previous note, I checked in w/family periodically. As of this writing they are still bedside. His wife said, "He was so full of life" and is still finding his death difficult to believe. She was thankful of support but still is not ready to depart. Her family asked for a wheelchair to transport her out of the hospital saying they had to stop a couple times to bring her to the unit.  We were able to accommodate.  Family remains in the room. Please page if additional assistance is needed. [place], MDiv

## 2019-12-02 NOTE — Progress Notes (Signed)
Patient's wife arrived and is at bedside with daughters and friend. Chaplain at bedside.

## 2019-12-02 DEATH — deceased

## 2019-12-04 LAB — CULTURE, BLOOD (ROUTINE X 2)
Culture: NO GROWTH
Culture: NO GROWTH
Special Requests: ADEQUATE
Special Requests: ADEQUATE

## 2019-12-23 LAB — FUNGUS CULTURE WITH STAIN

## 2019-12-23 LAB — FUNGAL ORGANISM REFLEX

## 2019-12-23 LAB — FUNGUS CULTURE RESULT

## 2021-04-24 IMAGING — DX DG CHEST 1V PORT
1 series · 1 of 1 positions shown · non-contrast
Comparison: Chest radiograph 11/20/2019

CLINICAL DATA: Acute respiratory failure with hypoxemia

EXAM:
PORTABLE CHEST 1 VIEW

[chest ap]
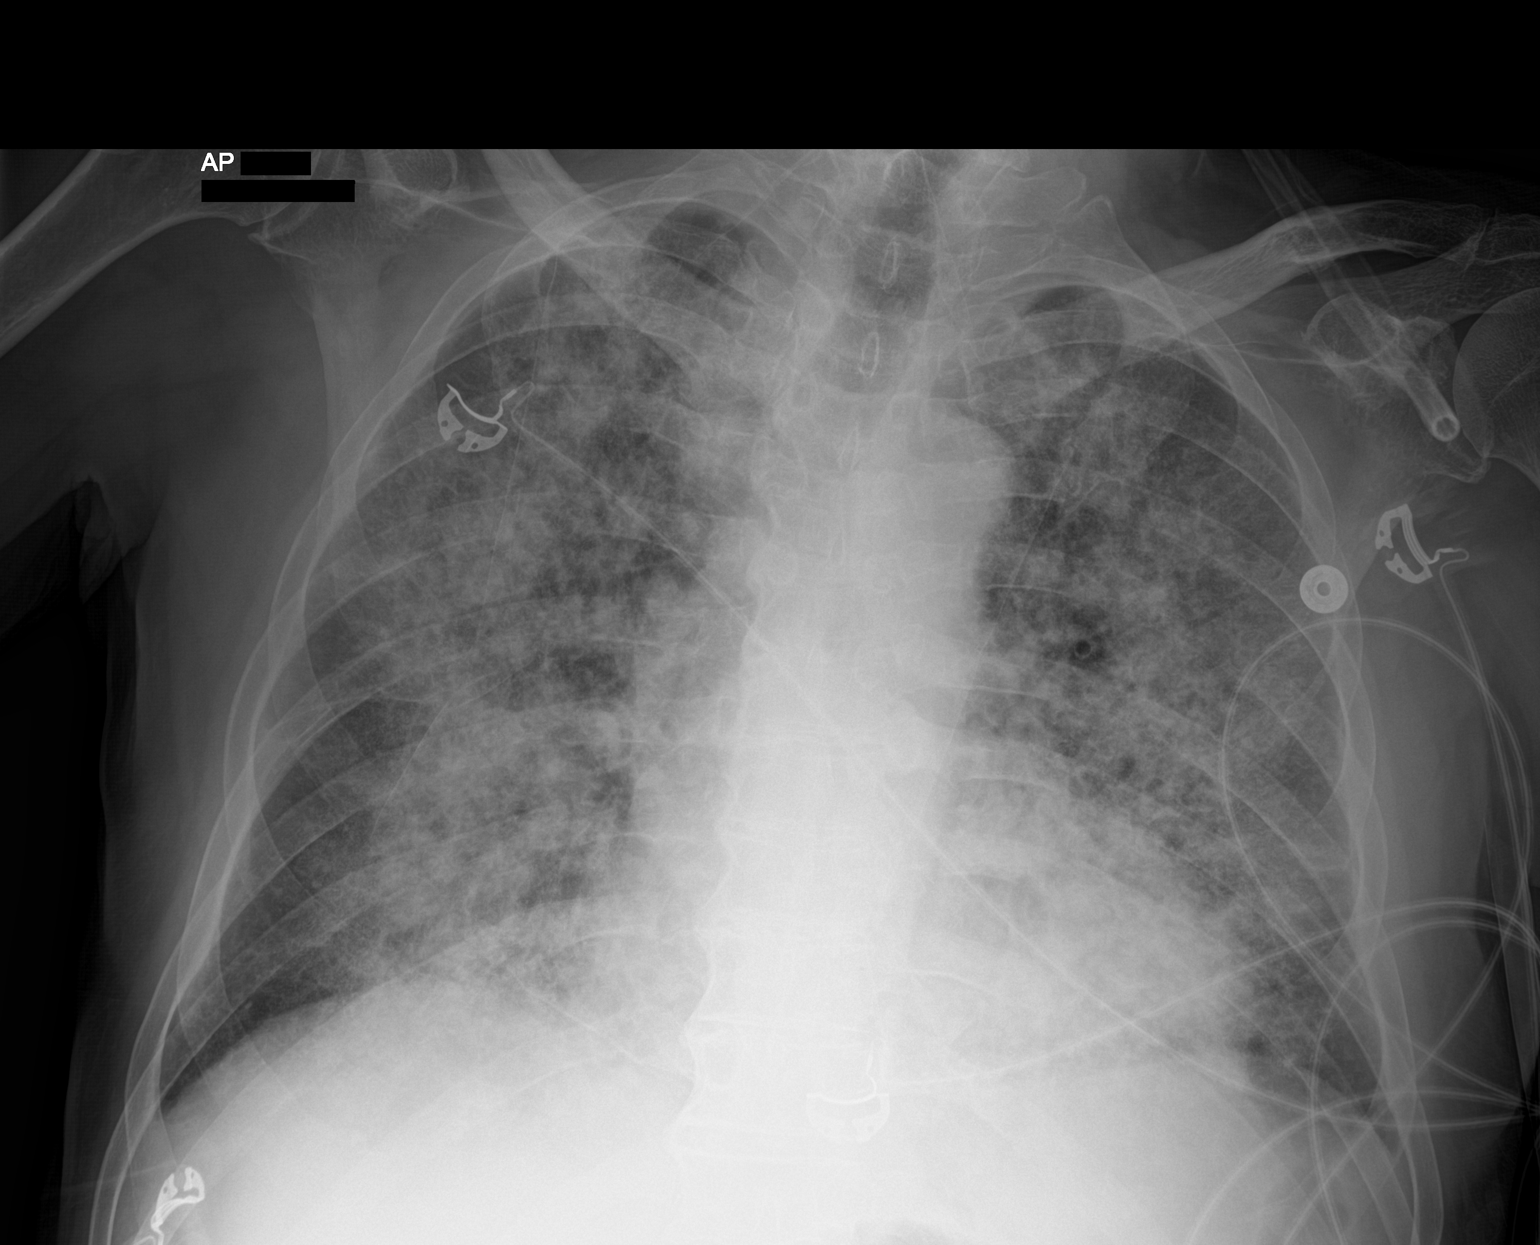

[1 of 1 positions shown; findings below may reference images not displayed]

FINDINGS: Stable cardiomediastinal contours. Diffuse bilateral airspace
opacities not significantly changed from prior. No pneumothorax or
large pleural effusion. No acute finding in the visualized skeleton.
IMPRESSION: No significant interval change in diffuse bilateral airspace
opacities.

## 2021-04-24 IMAGING — DX DG ABD PORTABLE 1V
1 series · 1 of 1 positions shown · non-contrast
Comparison: Abdominal radiograph 11/20/2019

CLINICAL DATA: 2nd attempt placing OG tube.

EXAM:
PORTABLE ABDOMEN - 1 VIEW

[abdomen kub]
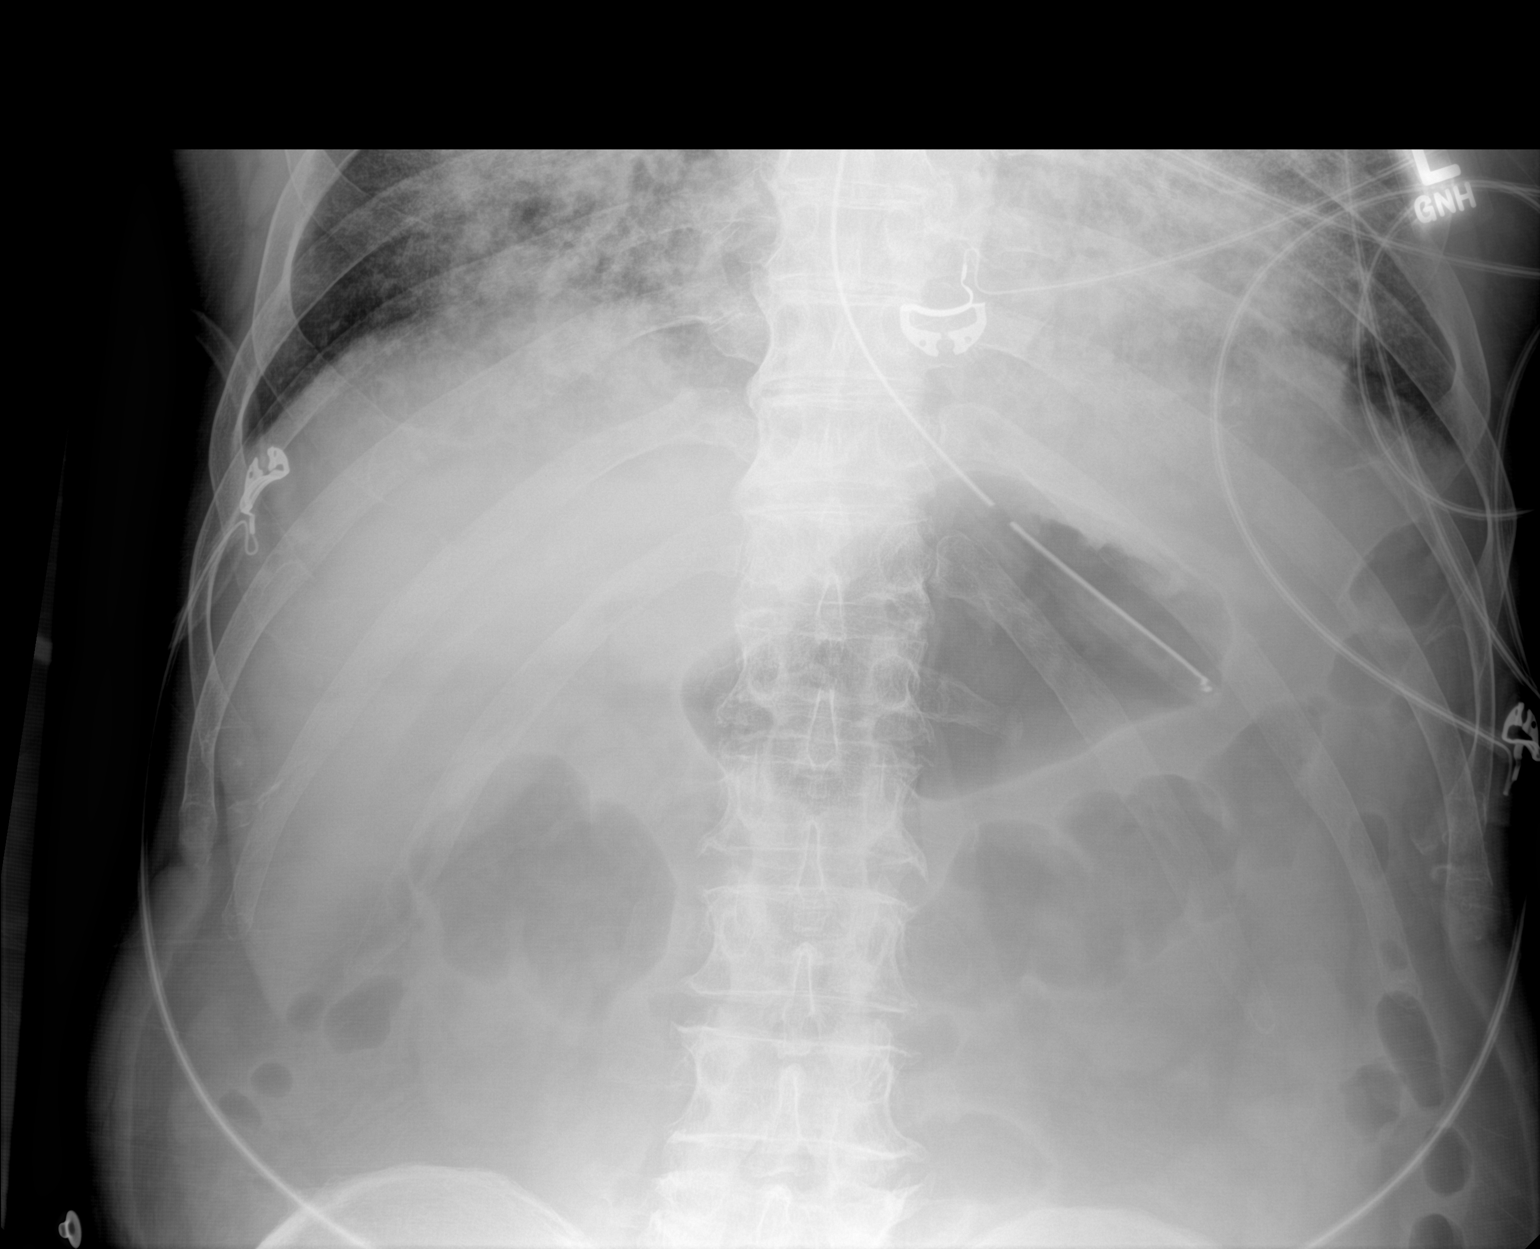

[1 of 1 positions shown; findings below may reference images not displayed]

FINDINGS: Interval placement of a nasogastric tube with side port
appropriately projecting over the stomach. Nonobstructive bowel gas
pattern. No supine evidence for free air. Pulmonary opacities better
evaluated on concurrent chest radiograph.
IMPRESSION: Interval placement of a nasogastric tube with side port
appropriately projecting over the stomach.

## 2021-04-27 IMAGING — DX DG ABDOMEN 1V
1 series · 1 of 1 positions shown · non-contrast
Comparison: 11/20/2019

CLINICAL DATA: Feeding tube repositioning

EXAM:
ABDOMEN - 1 VIEW

[abdomen kub]
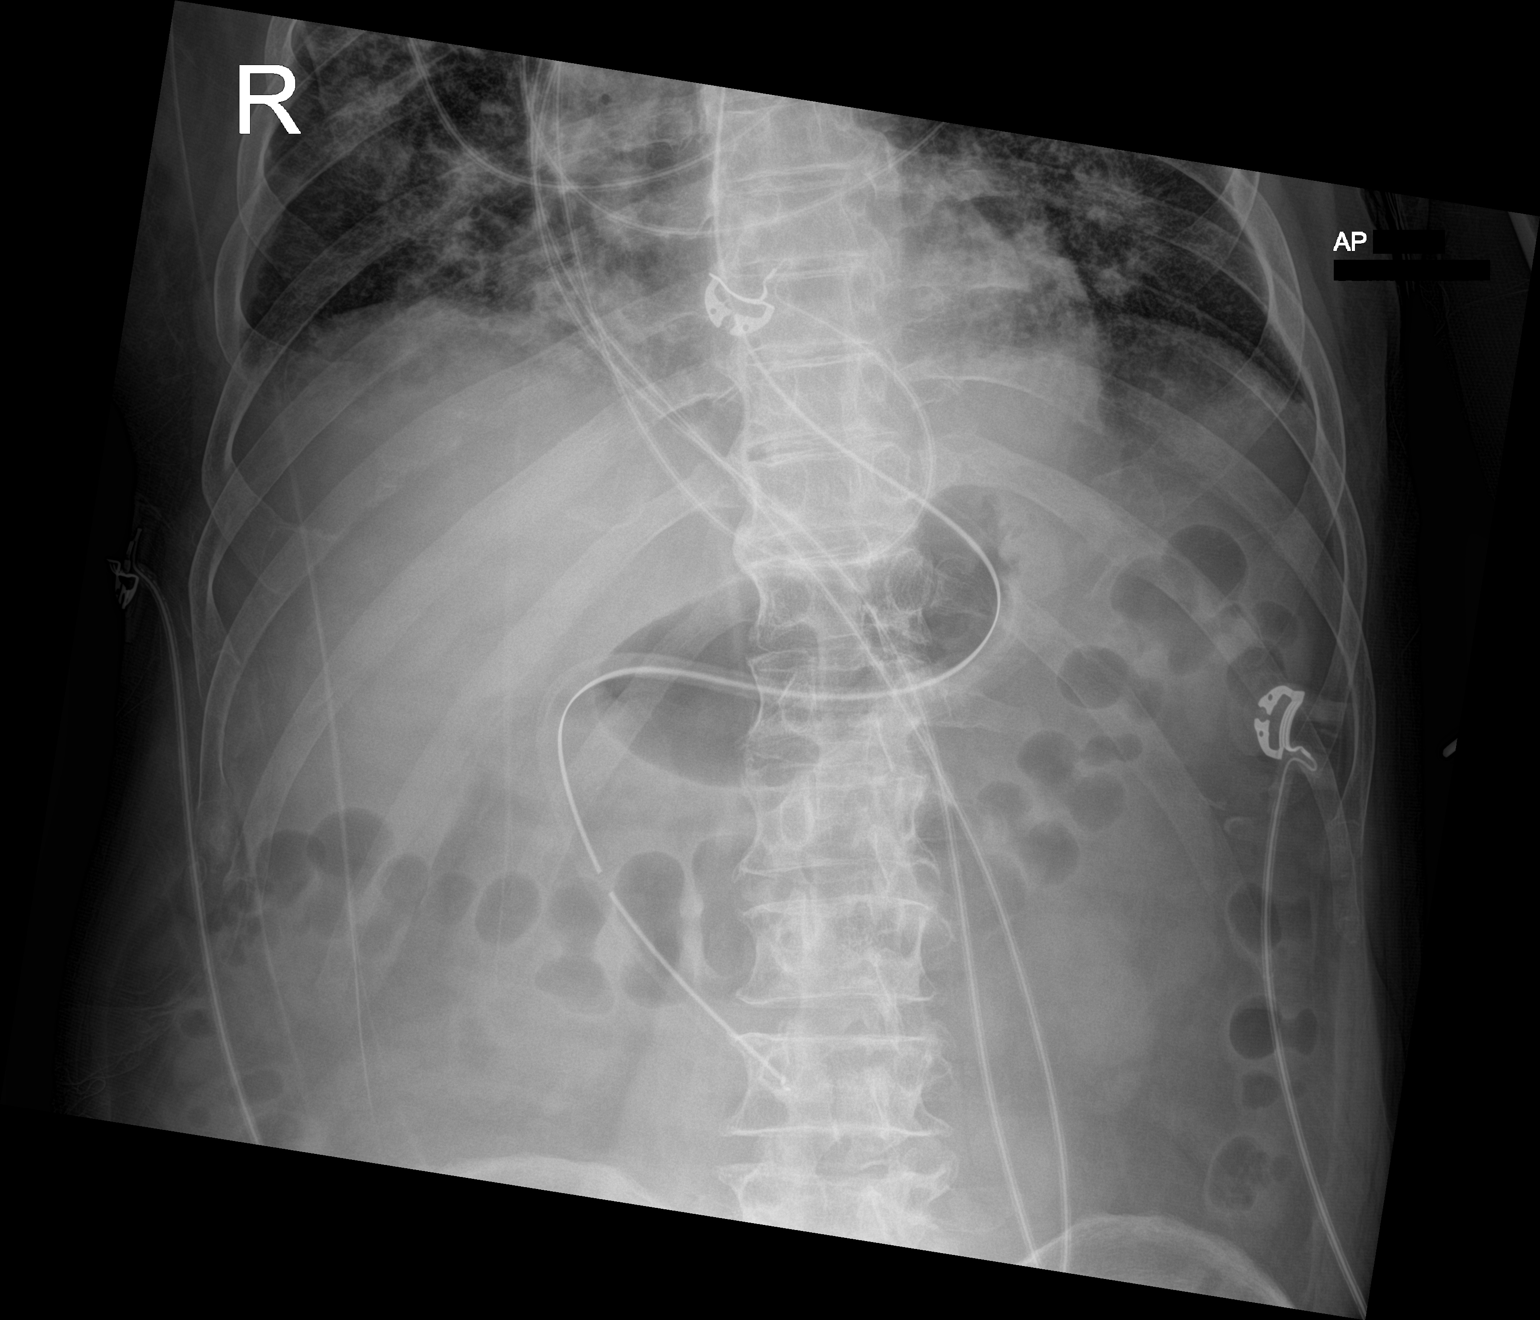

[1 of 1 positions shown; findings below may reference images not displayed]

FINDINGS: Frontal view of the lower chest and upper abdomen demonstrates
enteric catheter projecting over the expected course of the proximal
duodenum, tip overlying second portion duodenum. Bowel gas pattern
is unremarkable. Persistent interstitial and ground-glass opacities
at the lung bases, though improved since prior study.
IMPRESSION: 1. Enteric catheter tip projecting over second portion duodenum.
2. Persistent but improving bibasilar interstitial and ground-glass
opacities.

## 2021-04-28 IMAGING — DX DG ABD PORTABLE 1V
2 series · 2 of 2 positions shown · non-contrast
Comparison: November 23, 2019

CLINICAL DATA: Orogastric tube placement

EXAM:
PORTABLE ABDOMEN - 1 VIEW

[abdomen kub (1 of 2)]
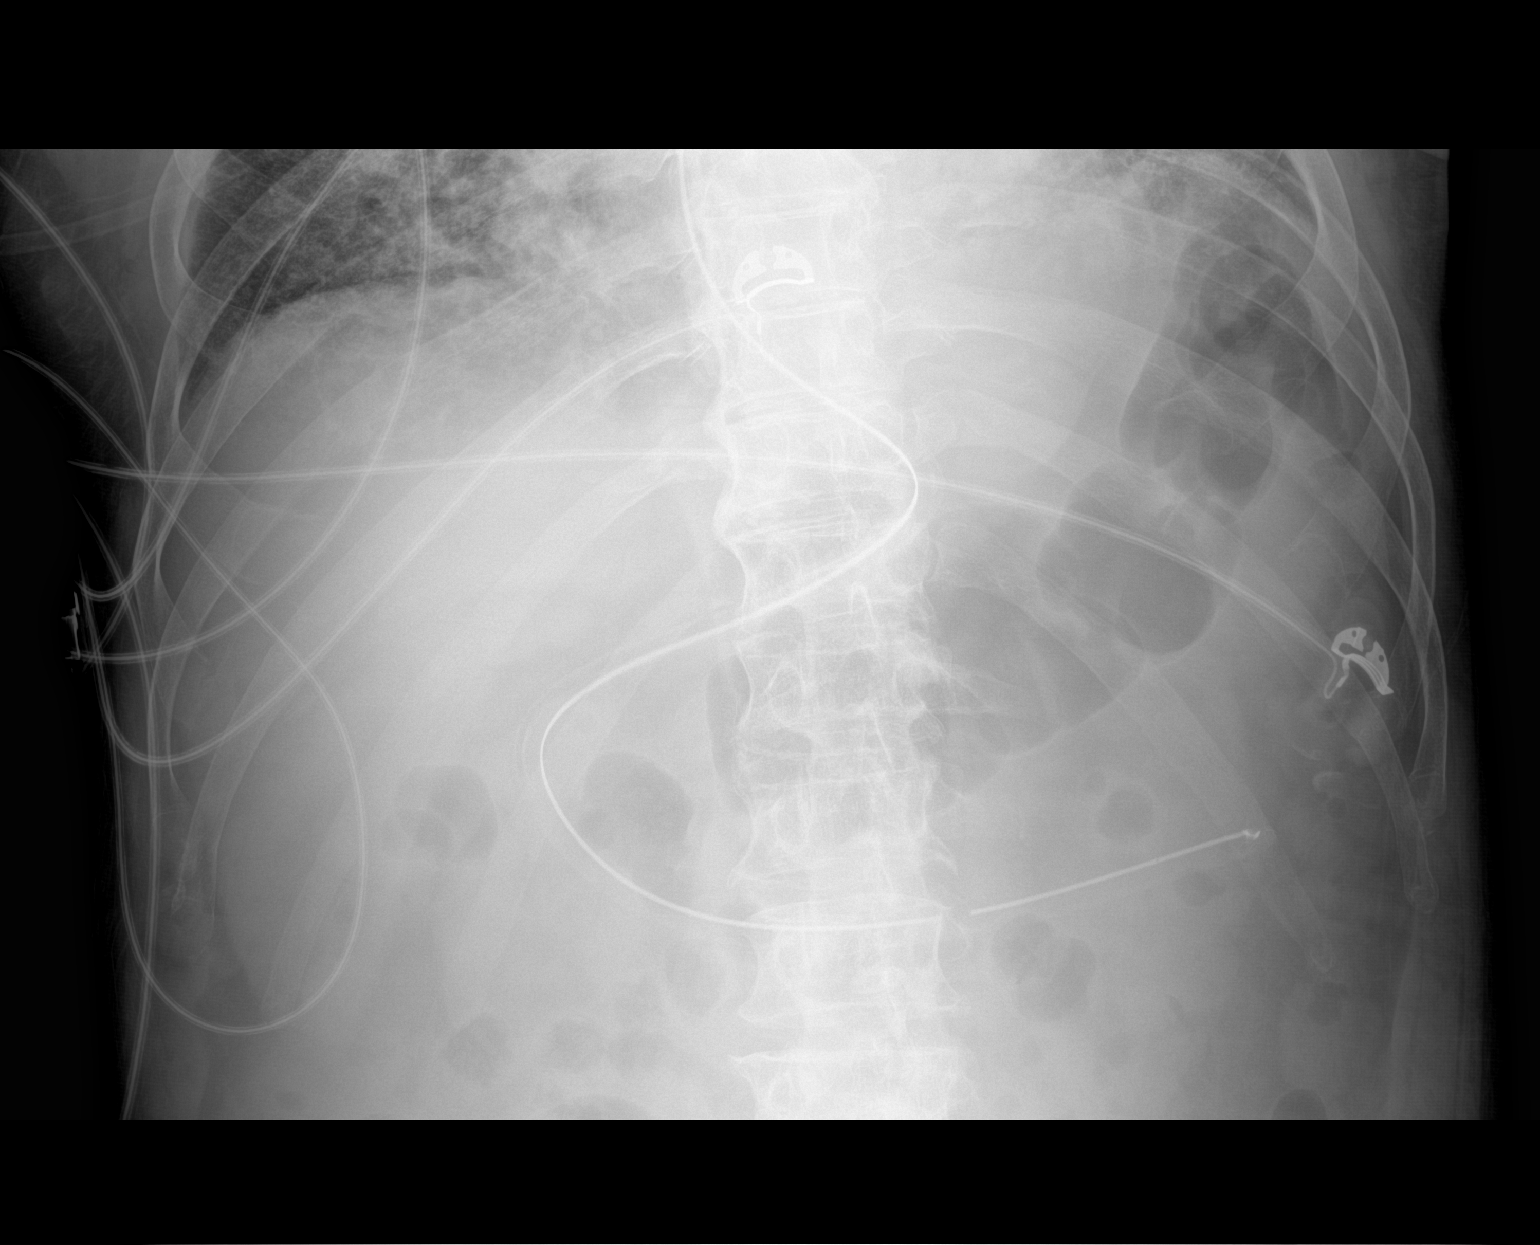

[abdomen kub (2 of 2)]
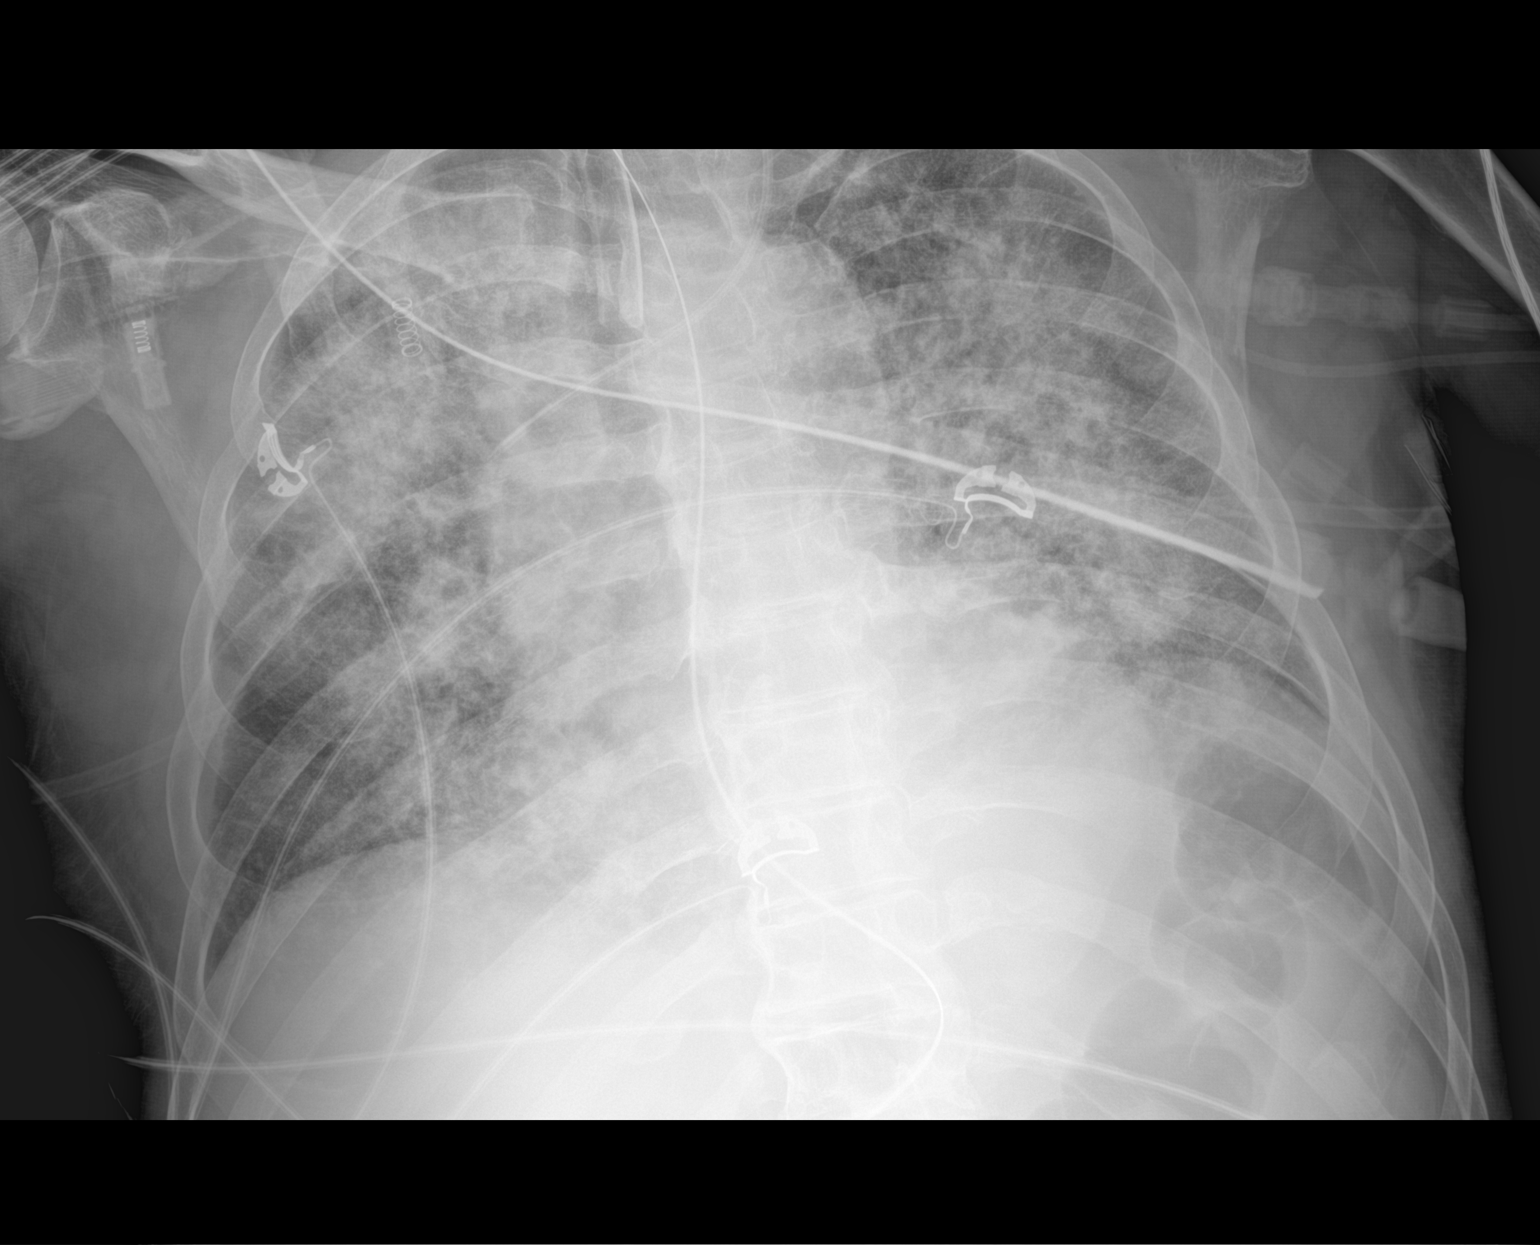

[2 of 2 positions shown; findings below may reference images not displayed]

FINDINGS: Orogastric tube tip is at the duodenal-jejunal junction. No bowel
dilatation or air-fluid level noted to suggest bowel obstruction. No
free air. Patchy airspace opacity in both lung bases noted.
IMPRESSION: Orogastric tube tip at the duodenal-jejunal junction. No bowel
obstruction or free air. Infiltrate in both lung bases.
